# Patient Record
Sex: Female | Born: 1950
Health system: Southern US, Community
[De-identification: ages and names within clinical notes are randomized; demographics above are authoritative.]

## PROBLEM LIST (undated history)

## (undated) DIAGNOSIS — G510 Bell's palsy: Secondary | ICD-10-CM

## (undated) DIAGNOSIS — G4733 Obstructive sleep apnea (adult) (pediatric): Secondary | ICD-10-CM

## (undated) DIAGNOSIS — E7800 Pure hypercholesterolemia, unspecified: Secondary | ICD-10-CM

## (undated) DIAGNOSIS — I639 Cerebral infarction, unspecified: Secondary | ICD-10-CM

## (undated) HISTORY — PX: ABDOMINAL HYSTERECTOMY: SHX81

## (undated) HISTORY — DX: Bell's palsy: G51.0

## (undated) HISTORY — DX: Pure hypercholesterolemia, unspecified: E78.00

## (undated) HISTORY — PX: TONSILLECTOMY: SUR1361

## (undated) HISTORY — PX: APPENDECTOMY: SHX54

## (undated) HISTORY — DX: Cerebral infarction, unspecified: I63.9

## (undated) HISTORY — DX: Obstructive sleep apnea (adult) (pediatric): G47.33

---

## 1999-09-24 ENCOUNTER — Encounter: Payer: Self-pay | Admitting: Internal Medicine

## 1999-09-24 ENCOUNTER — Encounter: Admission: RE | Admit: 1999-09-24 | Discharge: 1999-09-24 | Payer: Self-pay | Admitting: Internal Medicine

## 2001-03-24 ENCOUNTER — Other Ambulatory Visit: Admission: RE | Admit: 2001-03-24 | Discharge: 2001-03-24 | Payer: Self-pay | Admitting: Obstetrics and Gynecology

## 2004-10-07 ENCOUNTER — Ambulatory Visit: Payer: Self-pay | Admitting: Internal Medicine

## 2005-08-27 ENCOUNTER — Ambulatory Visit: Payer: Self-pay | Admitting: Internal Medicine

## 2006-01-20 ENCOUNTER — Ambulatory Visit: Payer: Self-pay | Admitting: Internal Medicine

## 2013-06-29 ENCOUNTER — Telehealth: Payer: Self-pay

## 2013-06-29 NOTE — Telephone Encounter (Signed)
Patient called to let medical records know that her husbands records need to be sent to [facility] at [address] [place] [address]  To Dr. Hyacinth Meeker and the fax number is: [phone]. I was unable to find his release in the release book. Does anyone know where it might be?

## 2013-06-29 NOTE — Telephone Encounter (Signed)
Wrong address entered its supposed to be: [address] in [place].

## 2013-06-29 NOTE — Telephone Encounter (Signed)
Mikayla Ross    PATIENT WOULD LIKE YOU TO CALL HER AT [phone] CONTINUING CALL FROM YESTERDAY

## 2013-06-29 NOTE — Telephone Encounter (Signed)
Faxed release to request records to their office.

## 2017-12-22 ENCOUNTER — Emergency Department (HOSPITAL_COMMUNITY)
Admission: EM | Admit: 2017-12-22 | Discharge: 2017-12-22 | Disposition: A | Discharge status: Home / Self Care | Payer: Medicare HMO | Attending: Emergency Medicine | Admitting: Emergency Medicine

## 2017-12-22 ENCOUNTER — Encounter (HOSPITAL_COMMUNITY): Payer: Self-pay | Admitting: Emergency Medicine

## 2017-12-22 ENCOUNTER — Other Ambulatory Visit: Payer: Self-pay

## 2017-12-22 DIAGNOSIS — G510 Bell's palsy: Secondary | ICD-10-CM | POA: Insufficient documentation

## 2017-12-22 DIAGNOSIS — R29810 Facial weakness: Secondary | ICD-10-CM | POA: Diagnosis present

## 2017-12-22 MED ORDER — PREDNISONE 10 MG (21) PO TBPK: ORAL_TABLET | Freq: Every day | ORAL | 0 refills | Status: DC

## 2017-12-22 MED ORDER — ACYCLOVIR 400 MG PO TABS: 400.0000 mg | ORAL_TABLET | Freq: Four times a day (QID) | ORAL | 0 refills | Status: AC

## 2017-12-22 NOTE — ED Notes (Signed)
Pt voices understanding of discharge instructions and follow up information. NAD on departure. A/o x4.

## 2017-12-22 NOTE — ED Triage Notes (Signed)
Pt to ER for evaluation of right facial droop and inability to close her eye, denies all other weakness, dizziness, visual difficulty. No weakness noted on exam, no drift, no aphasia. MD Long in the assess patient. These symptoms began yesterday.

## 2017-12-22 NOTE — ED Notes (Signed)
EDP Long confirms Bells Palsy.

## 2017-12-22 NOTE — Discharge Instructions (Signed)
You were seen in the emergency department with weakness on the right side of your face.  We are prescribing steroid and antiviral medicines.  Take these as directed.  Use over-the-counter eyedrops to keep the right eye moist.  Tape it closed at night.  Follow-up with your primary care physician or allergy as needed.  Return to the emergency department with any weakness or numbness in the arms or legs.  Also return with any pain in the right eye.

## 2017-12-22 NOTE — ED Provider Notes (Signed)
Emergency Department Provider Note   I have reviewed the triage vital signs and the nursing notes.   HISTORY  Chief Complaint Facial Droop (bells palsy)   HPI Mikayla Ross is a 67 y.o. female presents to the emergency department for evaluation of right face weakness and eye watering starting yesterday.  Patient denies any pain in the eye.  No numbness or tingling.  No weakness in the upper or lower extremities.  Denies any ear pain or ringing in the ears.  No rash to the face.  No history of similar symptoms.  No gait instability, difficulty speaking, or difficulty swallowing.  The patient denies any pain in the right eye but states it is irritated and watering frequently.  She does not wear contact lenses.  No radiation of symptoms or modifying factors.   History reviewed. No pertinent past medical history.  There are no active problems to display for this patient.   History reviewed. No pertinent surgical history.    Allergies Patient has no allergy information on record.  History reviewed. No pertinent family history.  Social History Social History   Tobacco Use  . Smoking status: Never Smoker  . Smokeless tobacco: Never Used  Substance Use Topics  . Alcohol use: No    Frequency: Never  . Drug use: No    Review of Systems  Constitutional: No fever/chills Eyes: No visual changes. ENT: No sore throat. Cardiovascular: Denies chest pain. Respiratory: Denies shortness of breath. Gastrointestinal: No abdominal pain.  No nausea, no vomiting.  No diarrhea.  No constipation. Genitourinary: Negative for dysuria. Musculoskeletal: Negative for back pain. Skin: Negative for rash. Neurological: Negative for headaches, focal weakness or numbness. Positive right face droop.   10-point ROS otherwise negative.  ____________________________________________   PHYSICAL EXAM:  VITAL SIGNS: ED Triage Vitals  Enc Vitals Group     BP 12/22/17 1914 (!) 190/79     Pulse  Rate 12/22/17 1914 74     Resp 12/22/17 1914 16     Temp 12/22/17 1914 98.3 F (36.8 C)     Temp Source 12/22/17 1914 Oral     SpO2 12/22/17 1914 98 %     Pain Score 12/22/17 1916 0   Constitutional: Alert and oriented. Well appearing and in no acute distress. Eyes: Conjunctivae are normal. PERRL. EOMI. Inability to fully close the right eyelid.  Head: Atraumatic. Nose: No congestion/rhinnorhea. Mouth/Throat: Mucous membranes are moist.  Neck: No stridor.  Cardiovascular: Normal rate, regular rhythm. Good peripheral circulation. Grossly normal heart sounds.   Respiratory: Normal respiratory effort.  No retractions. Lungs CTAB. Gastrointestinal: Soft and nontender. No distention.  Musculoskeletal: No lower extremity tenderness nor edema. No gross deformities of extremities. Neurologic:  Normal speech and language. No gross focal neurologic deficits are appreciated. Dense right face weakness involving the forehead and right eyelid. Normal sensation to face and extremities. No pronator drift. Normal gait.  Skin:  Skin is warm, dry and intact. No rash noted.  ____________________________________________  RADIOLOGY  None ____________________________________________   PROCEDURES  Procedure(s) performed:   Procedures  None ____________________________________________   INITIAL IMPRESSION / ASSESSMENT AND PLAN / ED COURSE  Pertinent labs & imaging results that were available during my care of the patient were reviewed by me and considered in my medical decision making (see chart for details).  Patient presents to the emergency department for evaluation of right face droop with exam consistent for Bell's palsy.  No other focal neurological deficits to suggest stroke.  No  indication for imaging of the head or labs at this time.  I had a Cato Liburd discussion with the patient regarding eye care including moisturizing drops and keeping the eye closed while asleep.  No symptoms or signs to  suggest corneal abrasion.  Starting patient on steroid along with antiviral medication.  She will follow with her PCP and neurology as needed if symptoms persist.   At this time, I do not feel there is any life-threatening condition present. I have reviewed and discussed all results (EKG, imaging, lab, urine as appropriate), exam findings with patient. I have reviewed nursing notes and appropriate previous records.  I feel the patient is safe to be discharged home without further emergent workup. Discussed usual and customary return precautions. Patient and family (if present) verbalize understanding and are comfortable with this plan.  Patient will follow-up with their primary care provider. If they do not have a primary care provider, information for follow-up has been provided to them. All questions have been answered.  ____________________________________________  FINAL CLINICAL IMPRESSION(S) / ED DIAGNOSES  Final diagnoses:  Bell's palsy     NEW OUTPATIENT MEDICATIONS STARTED DURING THIS VISIT:  Discharge Medication List as of 12/22/2017  7:32 PM    START taking these medications   Details  acyclovir (ZOVIRAX) 400 MG tablet Take 1 tablet (400 mg total) by mouth 4 (four) times daily for 10 days., Starting Wed 12/22/2017, Until Sat 01/01/2018, Print    predniSONE (STERAPRED UNI-PAK 21 TAB) 10 MG (21) TBPK tablet Take by mouth daily. Take 6 tabs by mouth daily  for 2 days, then 5 tabs for 2 days, then 4 tabs for 2 days, then 3 tabs for 2 days, 2 tabs for 2 days, then 1 tab by mouth daily for 2 days, Starting Wed 12/22/2017, Print        Note:  This document was prepared using Dragon voice recognition software and may include unintentional dictation errors.  Alona BeneJoshua Eloisa Chokshi, MD Emergency Medicine    Theona Muhs, Arlyss RepressJoshua G, MD 12/22/17 [phone]

## 2017-12-27 ENCOUNTER — Encounter: Payer: Self-pay | Admitting: Neurology

## 2017-12-27 ENCOUNTER — Ambulatory Visit: Payer: Medicare HMO | Admitting: Neurology

## 2017-12-27 VITALS — BP 128/70 | HR 89 | Ht 63.0 in | Wt 131.5 lb

## 2017-12-27 DIAGNOSIS — R0989 Other specified symptoms and signs involving the circulatory and respiratory systems: Secondary | ICD-10-CM

## 2017-12-27 DIAGNOSIS — G510 Bell's palsy: Secondary | ICD-10-CM

## 2017-12-27 HISTORY — DX: Bell's palsy: G51.0

## 2017-12-27 NOTE — Progress Notes (Signed)
Reason for visit: Right Bell's palsy  Referring physician: [place]  Mikayla Ross is a 67 y.o. female  History of present illness:  Mikayla Ross is a 67 year old right-handed white female with a history of onset of right facial weakness that came on spontaneously around 22 December 2017.  The patient had onset of weakness throughout the day, she denied any pain in the head or ear or jaw.  She has not had any alteration in hearing or change in taste.  The entire right side of the face became somewhat weak.  She went to the emergency room and was told she had Bell's palsy and was placed on prednisone and a course of Valtrex.  She is currently taking these medications.  She is using eyedrops in the eye, and she is careful to protect the cornea.  She is able to close the right eye if she tries hard.  The patient denies any problems with swallowing, she denies double vision or loss of vision.  She has not had any numbness or weakness of the arms or legs.  She has no numbness on the face.  She denies any balance changes or falls.  Has no problems controlling the bowels or the bladder.  She is sent to this office for further evaluation.  Past Medical History:  Diagnosis Date  . Right-sided Bell's palsy 12/27/2017    Past Surgical History:  Procedure Laterality Date  . APPENDECTOMY    . TONSILLECTOMY      Family History  Problem Relation Age of Onset  . Renal Disease Mother   . Seizures Father   . Renal Disease Father     Social history:  reports that she has been smoking.  She has been smoking about 0.50 packs per day. Her smokeless tobacco use includes chew. She reports that she does not drink alcohol or use drugs.  Medications:  Prior to Admission medications   Medication Sig Start Date End Date Taking? Authorizing Provider  acyclovir (ZOVIRAX) 400 MG tablet Take 1 tablet (400 mg total) by mouth 4 (four) times daily for 10 days. 12/22/17 01/01/18 Yes Long, Arlyss RepressJoshua G, MD  predniSONE (STERAPRED  UNI-PAK 21 TAB) 10 MG (21) TBPK tablet Take by mouth daily. Take 6 tabs by mouth daily  for 2 days, then 5 tabs for 2 days, then 4 tabs for 2 days, then 3 tabs for 2 days, 2 tabs for 2 days, then 1 tab by mouth daily for 2 days 12/22/17  Yes Long, Arlyss RepressJoshua G, MD     Not on File  ROS:  Out of a complete 14 system review of symptoms, the patient complains only of the following symptoms, and all other reviewed systems are negative.  Right face weakness  Blood pressure 128/70, pulse 89, height 5\' 3"  (1.6 m), weight 131 lb 8 oz (59.6 kg), SpO2 98 %.  Physical Exam  General: The patient is alert and cooperative at the time of the examination.  Eyes: Pupils are equal, round, and reactive to light. Discs are flat bilaterally.  Ears: Tympanic membranes are clear bilaterally.  Neck: The neck is supple, bilateral carotid bruits versus transmitted cardiac murmur is noted.  Respiratory: The respiratory examination is clear.  Cardiovascular: The cardiovascular examination reveals a regular rate and rhythm, with a grade I/VI systolic ejection murmur in the aortic area.  Skin: Extremities are without significant edema.  Neurologic Exam  Mental status: The patient is alert and oriented x 3 at the time of  the examination. The patient has apparent normal recent and remote memory, with an apparently normal attention span and concentration ability.  Cranial nerves: Facial symmetry is not present. There is good sensation of the face to pinprick and soft touch bilaterally. The strength of the muscles to head turning and shoulder shrug are normal bilaterally.  The patient has facial asymmetry, decreased strength on the right face including the forehead.  Speech is well enunciated, no aphasia or dysarthria is noted. Extraocular movements are full. Visual fields are full. The tongue is midline, and the patient has symmetric elevation of the soft palate. No obvious hearing deficits are noted.  Motor: The motor  testing reveals 5 over 5 strength of all 4 extremities. Good symmetric motor tone is noted throughout.  Sensory: Sensory testing is intact to pinprick, soft touch, vibration sensation, and position sense on all 4 extremities. No evidence of extinction is noted.  Coordination: Cerebellar testing reveals good finger-nose-finger and heel-to-shin bilaterally.  Gait and station: Gait is normal. Tandem gait is normal. Romberg is negative. No drift is seen.  Reflexes: Deep tendon reflexes are symmetric and normal bilaterally. Toes are downgoing bilaterally.   Assessment/Plan:  1.  Right Bell's palsy  2.  Bilateral carotid bruits versus transmitted cardiac murmur  The patient has what looks like a right Bell's palsy without complete weakness.  Hopefully her recovery time will be relatively short.  She will try to protect the cornea, get eyedrops and use them frequently.  The patient may have bilateral carotid bruits versus a transmitted cardiac murmur, we will get carotid Doppler studies.  The patient will follow-up in 3 months.  The patient indicates that she does not have a primary care physician.  Marlan Palau MD 12/27/2017 10:39 AM  [facility] [address] [place], [place] [address]  Phone [phone] Fax [phone]

## 2017-12-27 NOTE — Patient Instructions (Addendum)
Get a wetting solution for the eyes, such as artificial tears with methylcellulose.  We will get a carotid doppler.   Bell Palsy, Adult Bell palsy is a short-term inability to move muscles in part of the face. The inability to move (paralysis) results from inflammation or compression of the facial nerve, which travels along the skull and under the ear to the side of the face (7th cranial nerve). This nerve is responsible for facial movements that include blinking, closing the eyes, smiling, and frowning. What are the causes? The exact cause of this condition is not known. It may be caused by an infection from a virus, such as the chickenpox (herpes zoster), Epstein-Barr, or mumps virus. What increases the risk? You are more likely to develop this condition if:  You are pregnant.  You have diabetes.  You have had a recent infection in your nose, throat, or airways (upper respiratory infection).  You have a weakened body defense system (immune system).  You have had a facial injury, such as a fracture.  You have a family history of Bell palsy.  What are the signs or symptoms? Symptoms of this condition include:  Weakness on one side of the face.  Drooping eyelid and corner of the mouth.  Excessive tearing in one eye.  Difficulty closing the eyelid.  Dry eye.  Drooling.  Dry mouth.  Changes in taste.  Change in facial appearance.  Pain behind one ear.  Ringing in one or both ears.  Sensitivity to sound in one ear.  Facial twitching.  Headache.  Impaired speech.  Dizziness.  Difficulty eating or drinking.  Most of the time, only one side of the face is affected. Rarely, Bell palsy affects the whole face. How is this diagnosed? This condition is diagnosed based on:  Your symptoms.  Your medical history.  A physical exam.  You may also have to see health care providers who specialize in disorders of the nerves (neurologist) or diseases and conditions  of the eye (ophthalmologist). You may have tests, such as:  A test to check for nerve damage (electromyogram).  Imaging studies, such as CT or MRI scans.  Blood tests.  How is this treated? This condition affects every person differently. Sometimes symptoms go away without treatment within a couple weeks. If treatment is needed, it varies from person to person. The goal of treatment is to reduce inflammation and protect the eye from damage. Treatment for Bell palsy may include:  Medicines, such as: ? Steroids to reduce swelling and inflammation. ? Antiviral drugs. ? Pain relievers, including aspirin, acetaminophen, or ibuprofen.  Eye drops or ointment to keep your eye moist.  Eye protection, if you cannot close your eye.  Exercises or massage to regain muscle strength and function (physical therapy).  Follow these instructions at home:  Take over-the-counter and prescription medicines only as told by your health care provider.  If your eye is affected: ? Keep your eye moist with eye drops or ointment as told by your health care provider. ? Follow instructions for eye care and protection as told by your health care provider.  Do any physical therapy exercises as told by your health care provider.  Keep all follow-up visits as told by your health care provider. This is important. Contact a health care provider if:  You have a fever.  Your symptoms do not get better within 2-3 weeks, or your symptoms get worse.  Your eye is red, irritated, or painful.  You have new  symptoms. Get help right away if:  You have weakness or numbness in a part of your body other than your face.  You have trouble swallowing.  You develop neck pain or stiffness.  You develop dizziness or shortness of breath. Summary  Bell palsy is a short-term inability to move muscles in part of the face. The inability to move (paralysis) results from inflammation or compression of the facial  nerve.  This condition affects every person differently. Sometimes symptoms go away without treatment within a couple weeks.  If treatment is needed, it varies from person to person. The goal of treatment is to reduce inflammation and protect the eye from damage.  Contact your health care provider if your symptoms do not get better within 2-3 weeks, or your symptoms get worse. This information is not intended to replace advice given to you by your health care provider. Make sure you discuss any questions you have with your health care provider. Document Released: 11/16/2005 Document Revised: 01/19/2017 Document Reviewed: 01/19/2017 Elsevier Interactive Patient Education  [employer]2018 [employer]vier Inc[employer]

## 2018-01-03 ENCOUNTER — Telehealth: Payer: Self-pay | Admitting: Neurology

## 2018-01-03 ENCOUNTER — Ambulatory Visit (HOSPITAL_COMMUNITY)
Admission: RE | Admit: 2018-01-03 | Discharge: 2018-01-03 | Disposition: A | Discharge status: Home / Self Care | Payer: Medicare HMO | Source: Ambulatory Visit | Attending: Neurology | Admitting: Neurology

## 2018-01-03 DIAGNOSIS — I6523 Occlusion and stenosis of bilateral carotid arteries: Secondary | ICD-10-CM | POA: Diagnosis not present

## 2018-01-03 DIAGNOSIS — R0989 Other specified symptoms and signs involving the circulatory and respiratory systems: Secondary | ICD-10-CM

## 2018-01-03 NOTE — Telephone Encounter (Signed)
I called the patient.  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   Carotid doppler 01/03/18:  Right Carotid: Velocities in the right ICA are consistent with a 40-59%        stenosis. The ECA appears >50% stenosed.  Left Carotid: Velocities in the left ICA are consistent with a 1-39% stenosis.  Vertebrals: Right vertebral artery was patent with antegrade flow. Left      vertebral demonstrates abnormal flow. suggestive of distal      stenosis/occlusion

## 2018-01-03 NOTE — Progress Notes (Signed)
*  PRELIMINARY RESULTS* Vascular Ultrasound Carotid Duplex (Doppler) has been completed.  Preliminary findings:   Right Carotid: Velocities in the right ICA are consistent with a 40-59% stenosis. The ECA appears >50% stenosed.    Left Carotid: Velocities in the left ICA are consistent with a 1-39% stenosis.    Vertebrals: Right vertebral artery was patent with antegrade flow. Left vertebral demonstrates abnormal flow.     Mikayla FischerCharlotte C Jenney Brester 01/03/2018, 12:07 PM

## 2018-01-05 ENCOUNTER — Encounter: Payer: Self-pay | Admitting: Family Medicine

## 2018-01-05 ENCOUNTER — Other Ambulatory Visit: Payer: Self-pay | Admitting: Family Medicine

## 2018-01-05 ENCOUNTER — Ambulatory Visit (INDEPENDENT_AMBULATORY_CARE_PROVIDER_SITE_OTHER): Payer: Medicare HMO | Admitting: Family Medicine

## 2018-01-05 VITALS — BP 120/72 | HR 73 | Temp 98.3°F | Ht 63.0 in | Wt 127.5 lb

## 2018-01-05 DIAGNOSIS — I6523 Occlusion and stenosis of bilateral carotid arteries: Secondary | ICD-10-CM | POA: Diagnosis not present

## 2018-01-05 DIAGNOSIS — R413 Other amnesia: Secondary | ICD-10-CM

## 2018-01-05 HISTORY — DX: Occlusion and stenosis of bilateral carotid arteries: I65.23

## 2018-01-05 LAB — VITAMIN B12: VITAMIN B 12: 408 pg/mL (ref 211–911)

## 2018-01-05 LAB — CBC
HCT: 40.7 % (ref 36.0–46.0)
Hemoglobin: 14 g/dL (ref 12.0–15.0)
MCHC: 34.3 g/dL (ref 30.0–36.0)
MCV: 95.3 fl (ref 78.0–100.0)
Platelets: 187 10*3/uL (ref 150.0–400.0)
RBC: 4.27 Mil/uL (ref 3.87–5.11)
RDW: 12.6 % (ref 11.5–15.5)
WBC: 7.8 10*3/uL (ref 4.0–10.5)

## 2018-01-05 LAB — COMPREHENSIVE METABOLIC PANEL
ALT: 10 U/L (ref 0–35)
AST: 12 U/L (ref 0–37)
Albumin: 4.1 g/dL (ref 3.5–5.2)
Alkaline Phosphatase: 36 U/L — ABNORMAL LOW (ref 39–117)
BUN: 16 mg/dL (ref 6–23)
CO2: 31 mEq/L (ref 19–32)
Calcium: 9.4 mg/dL (ref 8.4–10.5)
Chloride: 106 mEq/L (ref 96–112)
Creatinine, Ser: 0.76 mg/dL (ref 0.40–1.20)
GFR: 81.06 mL/min (ref >60.00)
Glucose, Bld: 80 mg/dL (ref 70–99)
Potassium: 5.1 mEq/L (ref 3.5–5.1)
Sodium: 142 mEq/L (ref 135–145)
Total Bilirubin: 0.5 mg/dL (ref 0.2–1.2)
Total Protein: 6.6 g/dL (ref 6.0–8.3)

## 2018-01-05 LAB — TSH: TSH: 1.28 u[IU]/mL (ref 0.35–4.50)

## 2018-01-05 MED ORDER — ATORVASTATIN CALCIUM 20 MG PO TABS: 20.0000 mg | ORAL_TABLET | Freq: Every day | ORAL | 3 refills | Status: DC

## 2018-01-05 NOTE — Progress Notes (Signed)
Chief Complaint  Patient presents with  . Establish Care       New Patient Visit SUBJECTIVE: HPI: Mikayla Ross is an 67 y.o.female who is being seen for establishing care.  The patient has not been seen in >15 yrs.  The patient recently had a carotid Doppler that showed moderate stenosis on the right and mild stenosis on the left.  Her neurologist, who ordered the study, recommended low-dose aspirin and following up in 1 year.  She is not on a statin.  Her diet is fair, she is physically active however he dedicates no time to exercise.  For the past several months, the patient has been having worsening memory and decreased concentration.  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  As her father's medical needs have increased, so has her stress and a correlation with her worsening confusion has also been noted.  This bothers her husband more than the patient.  She has never been diagnosed with dementia and there is no family history of early onset Alzheimer's or dementia.  Her highest education level is high school.  No injuries to her head.  She takes no medications routinely.  Past Medical History:  Diagnosis Date  . Right-sided Bell's palsy 12/27/2017   Past Surgical History:  Procedure Laterality Date  . APPENDECTOMY    . TONSILLECTOMY     Social History   Socioeconomic History  . Marital status: Married  Occupational History  . Occupation: Retired  Tobacco Use  . Smoking status: Current Every Day Smoker    Packs/day: 0.50  . Smokeless tobacco: Current User    Types: Chew  Substance and Sexual Activity  . Alcohol use: No    Frequency: Never  . Drug use: No  Social History Narrative   Lives with husband   Caffeine use: Coffee daily and diet soda   Right handed    Family History  Problem Relation Age of Onset  . Renal Disease Mother   . Seizures Father   . Renal Disease Father    Takes no medications  routinely  ROS Cardiovascular: Denies chest pain  Neuro: As noted in HPI   OBJECTIVE: BP 120/72 (BP Location: Left Arm, Patient Position: Sitting, Cuff Size: Normal)   Pulse 73   Temp 98.3 F (36.8 C) (Oral)   Ht 5\' 3"  (1.6 m)   Wt 127 lb 8 oz (57.8 kg)   SpO2 99%   BMI 22.59 kg/m   Constitutional: -  VS reviewed -  Well developed, well nourished, appears stated age -  No apparent distress  Psychiatric: -  Oriented to person, place, and time -  Memory intact -  Affect and mood normal -  Fluent conversation, good eye contact -  Judgment and insight age appropriate  Eye: -  Conjunctivae clear, no discharge -  Pupils symmetric, round, reactive to light  ENMT: -  MMM    Pharynx moist, no exudate, no erythema  Neck: -  No gross swelling, no palpable masses -  Thyroid midline, not enlarged, mobile, no palpable masses  Cardiovascular: -  RRR -  No LE edema -  +b/l carotid bruits  Respiratory: -  Normal respiratory effort, no accessory muscle use, no retraction -  Breath sounds equal, no wheezes, no ronchi, no crackles  Neurological:  -  CN II - XII grossly intact -Alert to person, place, president, and year; did not know day of the  week, month, or date (other than year)  Musculoskeletal: -  No clubbing, no cyanosis -  Gait normal  Skin: -  No significant lesion on inspection -  Warm and dry to palpation   ASSESSMENT/PLAN: Memory loss or impairment - Plan: Ambulatory referral to Neuropsychology, CBC, Comprehensive metabolic panel, TSH, B12  Bilateral carotid artery stenosis - Plan: atorvastatin (LIPITOR) 20 MG tablet  Refer to neuropsychology team with [facility] for formal evaluation for possible dementia.  We will check some labs and if they are normal, will check a CT scan of her head without contrast.  I suspect there may be a stress component to this issue.  I did offer treatment with medication versus psychology, however the patient wishes to check labs and undergo formal  evaluation first.  Vascular dementia versus other type of dementia also in the differential. We will start Lipitor for her carotid artery stenosis.  Counseled on diet and exercise.  Encourage smoking cessation. Patient should return in 1 mo for CPE.  We will cover health maintenance topics at that visit. The patient and her husband voiced understanding and agreement to the plan.   Jilda Rocheicholas Paul [place], DO 01/05/18  12:23 PM

## 2018-01-05 NOTE — Progress Notes (Signed)
Pre visit review using our clinic review tool, if applicable. No additional management support is needed unless otherwise documented below in the visit note. 

## 2018-01-05 NOTE — Patient Instructions (Addendum)
If you do not hear anything about your referral in the next 1-2 weeks, call our office and ask for an update.  If your labs are normal, we will be ordering a scan of your head.  Aim to do some physical exertion for 150 minutes per week. This is typically divided into 5 days per week, 30 minutes per day. The activity should be enough to get your heart rate up. Anything is better than nothing if you have time constraints.  Let us know if you need anything.

## 2018-01-07 ENCOUNTER — Encounter: Payer: Self-pay | Admitting: Family Medicine

## 2018-01-12 ENCOUNTER — Telehealth: Payer: Self-pay | Admitting: Family Medicine

## 2018-01-12 NOTE — Telephone Encounter (Signed)
Called left message to call back 

## 2018-01-12 NOTE — Telephone Encounter (Signed)
Her neurologist ordered this and has gone over it with her per a note. It showed some occlusion, but nothing requiring surgery or immediate attention. She can call Dr. Anne HahnWillis' office for a more detailed report.

## 2018-01-12 NOTE — Telephone Encounter (Signed)
Copied from CRM [id number]. Topic: Quick Communication - See Telephone Encounter >> Jan 12, 2018 11:28 AM Terisa Starraylor, Brittany L wrote: CRM for notification. See Telephone encounter for:   01/12/18.   Patient wants to know the results of her artery scan. Call back is [phone]

## 2018-01-13 NOTE — Telephone Encounter (Signed)
Called left message to call back 

## 2018-02-14 ENCOUNTER — Encounter: Payer: Self-pay | Admitting: Family Medicine

## 2018-02-14 ENCOUNTER — Ambulatory Visit (INDEPENDENT_AMBULATORY_CARE_PROVIDER_SITE_OTHER): Payer: Medicare HMO | Admitting: Family Medicine

## 2018-02-14 VITALS — BP 120/78 | HR 68 | Temp 98.0°F | Ht 63.0 in | Wt 130.5 lb

## 2018-02-14 DIAGNOSIS — Z23 Encounter for immunization: Secondary | ICD-10-CM

## 2018-02-14 DIAGNOSIS — Z720 Tobacco use: Secondary | ICD-10-CM | POA: Diagnosis not present

## 2018-02-14 DIAGNOSIS — Z114 Encounter for screening for human immunodeficiency virus [HIV]: Secondary | ICD-10-CM

## 2018-02-14 DIAGNOSIS — Z0000 Encounter for general adult medical examination without abnormal findings: Secondary | ICD-10-CM | POA: Insufficient documentation

## 2018-02-14 DIAGNOSIS — Z1159 Encounter for screening for other viral diseases: Secondary | ICD-10-CM | POA: Diagnosis not present

## 2018-02-14 DIAGNOSIS — Z1231 Encounter for screening mammogram for malignant neoplasm of breast: Secondary | ICD-10-CM | POA: Diagnosis not present

## 2018-02-14 DIAGNOSIS — R69 Illness, unspecified: Secondary | ICD-10-CM | POA: Diagnosis not present

## 2018-02-14 DIAGNOSIS — Z1239 Encounter for other screening for malignant neoplasm of breast: Secondary | ICD-10-CM | POA: Insufficient documentation

## 2018-02-14 DIAGNOSIS — E2839 Other primary ovarian failure: Secondary | ICD-10-CM | POA: Diagnosis not present

## 2018-02-14 HISTORY — DX: Other primary ovarian failure: E28.39

## 2018-02-14 LAB — LIPID PANEL
Cholesterol: 199 mg/dL (ref 0–200)
HDL: 91.9 mg/dL (ref >39.00)
LDL Cholesterol: 97 mg/dL (ref 0–99)
NONHDL: 106.95
Total CHOL/HDL Ratio: 2
Triglycerides: 48 mg/dL (ref 0.0–149.0)
VLDL: 9.6 mg/dL (ref 0.0–40.0)

## 2018-02-14 LAB — COMPREHENSIVE METABOLIC PANEL
ALK PHOS: 43 U/L (ref 39–117)
ALT: 14 U/L (ref 0–35)
AST: 14 U/L (ref 0–37)
Albumin: 4.5 g/dL (ref 3.5–5.2)
BUN: 14 mg/dL (ref 6–23)
CO2: 30 meq/L (ref 19–32)
Calcium: 9.6 mg/dL (ref 8.4–10.5)
Chloride: 105 mEq/L (ref 96–112)
Creatinine, Ser: 0.82 mg/dL (ref 0.40–1.20)
GFR: 74.23 mL/min (ref >60.00)
GLUCOSE: 87 mg/dL (ref 70–99)
Potassium: 3.8 mEq/L (ref 3.5–5.1)
Sodium: 142 mEq/L (ref 135–145)
TOTAL PROTEIN: 7 g/dL (ref 6.0–8.3)
Total Bilirubin: 0.6 mg/dL (ref 0.2–1.2)

## 2018-02-14 NOTE — Progress Notes (Signed)
Pre visit review using our clinic review tool, if applicable. No additional management support is needed unless otherwise documented below in the visit note. 

## 2018-02-14 NOTE — Progress Notes (Addendum)
Chief Complaint  Patient presents with  . Annual Exam     Well Woman Mikayla Ross is here for a complete physical.   Her last physical was >1 year ago.  Current diet: in general, a "healthy" diet. Current exercise: physically active at home. Weight is stable and she denies daytime fatigue. No LMP recorded.  Seatbelt? Yes  Health Maintenance Colonoscopy- No - refuses DEXA- No  Mammogram- No Tetanus- No Pneumonia- No Hep C screen- No  Past Medical History:  Diagnosis Date  . Right-sided Bell's palsy 12/27/2017     Past Surgical History:  Procedure Laterality Date  . ABDOMINAL HYSTERECTOMY    . APPENDECTOMY    . TONSILLECTOMY      Medications  Current Outpatient Medications on File Prior to Visit  Medication Sig Dispense Refill  . atorvastatin (LIPITOR) 20 MG tablet Take 1 tablet (20 mg total) by mouth daily. 90 tablet 3    Allergies Not on File  Review of Systems: Constitutional:  no fevers, sweats, or chills Eye:  no recent significant change in vision Ear/Nose/Mouth/Throat:  Ears:  No changes in hearing, Nose/Mouth/Throat:  no complaints of nasal congestion, no sore throat Cardiovascular: no chest pain Respiratory:  No cough and no shortness of breath Gastrointestinal:  no abdominal pain, no change in bowel habits, no significant change in appetite, no nausea, vomiting, diarrhea, or constipation and no black or bloody stool GU:  Female: negative for dysuria, frequency, and incontinence; no abnormal bleeding, pelvic pain, or discharge Musculoskeletal/Extremities:  no pain of the joints Integumentary (Skin/Breast):  no abnormal skin lesions reported Neurologic:  no headaches Psychiatric:  no anxiety, no depression Endocrine:  denies fatigue Hematologic/Lymphatic:  no abnormal bleeding  Exam BP 120/78 (BP Location: Left Arm, Patient Position: Sitting, Cuff Size: Normal)   Pulse 68   Temp 98 F (36.7 C) (Oral)   Ht 5\' 3"  (1.6 m)   Wt 130 lb 8 oz (59.2 kg)    SpO2 98%   BMI 23.12 kg/m  General:  well developed, well nourished, in no apparent distress Skin:  no significant moles, warts, or growths Head:  no masses, lesions, or tenderness Eyes:  pupils equal and round, sclera anicteric without injection Ears:  canals without lesions, TMs shiny without retraction, no obvious effusion, no erythema Nose:  nares patent, septum midline, mucosa normal, and no drainage or sinus tenderness Throat/Pharynx:  lips and gingiva without lesion; tongue and uvula midline; non-inflamed pharynx; no exudates or postnasal drainage Neck: neck supple without adenopathy, thyromegaly, or masses Breasts:  Not done Thorax:  nontender Lungs:  clear to auscultation, breath sounds equal bilaterally, no respiratory distress Cardio:  regular rate and rhythm without murmurs, heart sounds without clicks or rubs, point of maximal impulse normal; no lifts, heaves, or thrills Abdomen:  abdomen soft, nontender; bowel sounds normal; no masses or organomegaly Genital: Deferred Musculoskeletal:  symmetrical muscle groups noted without atrophy or deformity Extremities:  no clubbing, cyanosis, or edema, no deformities, no skin discoloration Neuro:  gait normal; deep tendon reflexes normal and symmetric Psych: well oriented with normal range of affect and appropriate judgment/insight  Assessment and Plan  Well adult exam - Plan: Comprehensive metabolic panel, Lipid panel  Screening for HIV (human immunodeficiency virus) - Plan: HIV antibody  Encounter for hepatitis C screening test for low risk patient - Plan: Hepatitis C antibody  Screening for breast cancer - Plan: MM DIGITAL SCREENING BILATERAL  Estrogen deficiency - Plan: DG Bone Density   Well 67  y.o. female. Counseled on diet and exercise. Tdap and PCV13, PCV23 in 1 yr.  GYN contact info given in AVS. Counseled on colon cancer screening and stated this could be life saving. Also discussed FIT and cologard. She will think  about it. Other orders as above. 25 pack year smoking hx, counseled on cessation.  Follow up in 6 mo for med check. The patient voiced understanding and agreement to the plan.  Jilda Roche [place], DO 02/14/18 8:52 AM

## 2018-02-14 NOTE — Patient Instructions (Addendum)
Call Center for [facility] at [phone] for an appointment.  They are located at [address], [address], [place], [place], [phone] (right across the hall from our office).  Think about a colonoscopy! It can be life saving.  Aim to do some physical exertion for 150 minutes per week. This is typically divided into 5 days per week, 30 minutes per day. The activity should be enough to get your heart rate up. Anything is better than nothing if you have time constraints.    Colorectal Cancer Screening Colorectal cancer screening is a group of tests used to check for colorectal cancer. Colorectal refers to your colon and rectum. Your colon and rectum are located at the end of your large intestine and carry your bowel movements out of your body. Why is colorectal cancer screening done? It is common for abnormal growths (polyps) to form in the lining of your colon, especially as you get older. These polyps can be cancerous or become cancerous. If colorectal cancer is found at an early stage, it is treatable. Who should be screened for colorectal cancer? Screening is recommended for all adults at average risk starting at age 66. Tests may be recommended every 1 to 10 years. Your health care provider may recommend earlier or more frequent screening if you have:  A history of colorectal cancer or polyps.  A family member with a history of colorectal cancer or polyps.  Inflammatory bowel disease, such as ulcerative colitis or Crohn disease.  A type of hereditary colon cancer syndrome.  Colorectal cancer symptoms.  Types of screening tests There are several types of colorectal screening tests. They include:  Guaiac-based fecal occult blood testing.  Fecal immunochemical test (FIT).  Stool DNA test.  Barium enema.  Virtual colonoscopy.  Sigmoidoscopy. During this test, a sigmoidoscope is used to examine your rectum and lower colon. A sigmoidoscope is a  flexible tube with a camera that is inserted through your anus into your rectum and lower colon.  Colonoscopy. During this test, a colonoscope is used to examine your entire colon. A colonoscope is a long, thin, flexible tube with a camera. This test examines your entire colon and rectum.  This information is not intended to replace advice given to you by your health care provider. Make sure you discuss any questions you have with your health care provider. Document Released: 05/06/2010 Document Revised: 06/25/2016 Document Reviewed: 02/22/2014 Elsevier Interactive Patient Education  [employer]2018 Elsevier Inc.

## 2018-02-14 NOTE — Addendum Note (Signed)
Addended by: Scharlene GlossEWING, ROBIN B on: 02/14/2018 09:47 AM   Modules accepted: Orders

## 2018-02-15 LAB — HIV ANTIBODY (ROUTINE TESTING W REFLEX): HIV: NONREACTIVE

## 2018-02-15 LAB — HEPATITIS C ANTIBODY
Hepatitis C Ab: NONREACTIVE
SIGNAL TO CUT-OFF: 0.01 (ref <1.00)

## 2018-02-17 ENCOUNTER — Ambulatory Visit (HOSPITAL_BASED_OUTPATIENT_CLINIC_OR_DEPARTMENT_OTHER)
Admission: RE | Admit: 2018-02-17 | Discharge: 2018-02-17 | Disposition: A | Discharge status: Home / Self Care | Payer: Medicare HMO | Source: Ambulatory Visit | Attending: Family Medicine | Admitting: Family Medicine

## 2018-02-17 ENCOUNTER — Encounter (HOSPITAL_BASED_OUTPATIENT_CLINIC_OR_DEPARTMENT_OTHER): Payer: Self-pay

## 2018-02-17 DIAGNOSIS — Z1239 Encounter for other screening for malignant neoplasm of breast: Secondary | ICD-10-CM

## 2018-02-17 DIAGNOSIS — Z780 Asymptomatic menopausal state: Secondary | ICD-10-CM | POA: Diagnosis not present

## 2018-02-17 DIAGNOSIS — M85851 Other specified disorders of bone density and structure, right thigh: Secondary | ICD-10-CM | POA: Insufficient documentation

## 2018-02-17 DIAGNOSIS — Z1231 Encounter for screening mammogram for malignant neoplasm of breast: Secondary | ICD-10-CM | POA: Diagnosis not present

## 2018-02-17 DIAGNOSIS — E2839 Other primary ovarian failure: Secondary | ICD-10-CM

## 2018-02-18 ENCOUNTER — Telehealth: Payer: Self-pay | Admitting: Family Medicine

## 2018-02-18 ENCOUNTER — Other Ambulatory Visit: Payer: Self-pay | Admitting: Family Medicine

## 2018-02-18 DIAGNOSIS — R928 Other abnormal and inconclusive findings on diagnostic imaging of breast: Secondary | ICD-10-CM

## 2018-02-18 NOTE — Telephone Encounter (Signed)
Copied from CRM [id number]. Topic: Quick Communication - See Telephone Encounter >> Feb 18, 2018  1:04 PM Landry MellowFoltz, Melissa J wrote: CRM for notification. See Telephone encounter for: 02/18/18. pt would like a call and further clarifications about vitamins that she is taking.  [phone]

## 2018-02-18 NOTE — Telephone Encounter (Signed)
Patient needed clarification regarding todays phone call on bone density results/did give them to her and she did verbalize understanding// see result notes for bone density.

## 2018-02-21 ENCOUNTER — Other Ambulatory Visit: Payer: Self-pay | Admitting: Family Medicine

## 2018-02-21 DIAGNOSIS — Z9189 Other specified personal risk factors, not elsewhere classified: Secondary | ICD-10-CM

## 2018-02-21 MED ORDER — ALENDRONATE SODIUM 70 MG PO TABS: 70.0000 mg | ORAL_TABLET | ORAL | 11 refills | Status: DC

## 2018-02-21 NOTE — Progress Notes (Signed)
Alendronate called in as hip fx risk is >3%.

## 2018-02-22 ENCOUNTER — Telehealth: Payer: Self-pay | Admitting: *Deleted

## 2018-02-22 NOTE — Telephone Encounter (Signed)
Received Physician Orders from [facility]; forwarded to provider/SLS 03/26

## 2018-02-24 ENCOUNTER — Ambulatory Visit
Admission: RE | Admit: 2018-02-24 | Discharge: 2018-02-24 | Disposition: A | Discharge status: Home / Self Care | Payer: Medicare HMO | Source: Ambulatory Visit | Attending: Family Medicine | Admitting: Family Medicine

## 2018-02-24 DIAGNOSIS — R928 Other abnormal and inconclusive findings on diagnostic imaging of breast: Secondary | ICD-10-CM

## 2018-02-24 DIAGNOSIS — N6489 Other specified disorders of breast: Secondary | ICD-10-CM | POA: Diagnosis not present

## 2018-03-30 ENCOUNTER — Ambulatory Visit: Payer: Medicare HMO | Admitting: Adult Health

## 2018-04-05 NOTE — Progress Notes (Deleted)
[facility]  PATIENT: Mikayla Ross DOB: [birth year]  25-Nov-1952N FOR VISIT: *** HISTORY FROM:    HISTORY OF PRESENT ILLNESS:  Carotid doppler 01/03/18:  Right Carotid: Velocities in the right ICA are consistent with a 40-59%        stenosis. The ECA appears >50% stenosed.  Left Carotid: Velocities in the left ICA are consistent with a 1-39% stenosis.  Vertebrals: Right vertebral artery was patent with antegrade flow. Left      vertebral demonstrates abnormal flow. suggestive of distal      stenosis/occlusion The patient will begin asa. Repeat in 1 year   12/27/17 KWMs. Mikayla Ross is a 67 year old right-handed white female with a history of onset of right facial weakness that came on spontaneously around 22 December 2017.  The patient had onset of weakness throughout the day, she denied any pain in the head or ear or jaw.  She has not had any alteration in hearing or change in taste.  The entire right side of the face became somewhat weak.  She went to the emergency room and was told she had Bell's palsy and was placed on prednisone and a course of Valtrex.  She is currently taking these medications.  She is using eyedrops in the eye, and she is careful to protect the cornea.  She is able to close the right eye if she tries hard.  The patient denies any problems with swallowing, she denies double vision or loss of vision.  She has not had any numbness or weakness of the arms or legs.  She has no numbness on the face.  She denies any balance changes or falls.  Has no problems controlling the bowels or the bladder.  She is sent to this office for further evaluation.    REVIEW OF SYSTEMS: Full 14 system review of systems performed and notable only for those listed, all others are neg:  Constitutional: neg  Cardiovascular: neg Ear/Nose/Throat: neg  Skin: neg Eyes: neg Respiratory: neg Gastroitestinal: neg  Hematology/Lymphatic: neg  Endocrine:  neg Musculoskeletal:neg Allergy/Immunology: neg Neurological: neg Psychiatric: neg Sleep : neg   ALLERGIES: Not on File  HOME MEDICATIONS: Outpatient Medications Prior to Visit  Medication Sig Dispense Refill  . alendronate (FOSAMAX) 70 MG tablet Take 1 tablet (70 mg total) by mouth every 7 (seven) days. Take with a full glass of water on an empty stomach. 4 tablet 11  . atorvastatin (LIPITOR) 20 MG tablet Take 1 tablet (20 mg total) by mouth daily. 90 tablet 3   No facility-administered medications prior to visit.     PAST MEDICAL HISTORY: Past Medical History:  Diagnosis Date  . Right-sided Bell's palsy 12/27/2017    PAST SURGICAL HISTORY: Past Surgical History:  Procedure Laterality Date  . ABDOMINAL HYSTERECTOMY    . APPENDECTOMY    . TONSILLECTOMY      FAMILY HISTORY: Family History  Problem Relation Age of Onset  . Renal Disease Mother   . Seizures Father   . Renal Disease Father   . Cancer Neg Hx     SOCIAL HISTORY: Social History   Socioeconomic History  . Marital status: Married    Spouse name: Not on file  . Number of children: 2  . Years of education: 67  . Highest education level: Not on file  Occupational History  . Occupation: Retired  Engineer, production  . Financial resource strain: Not on file  . Food insecurity:    Worry: Not on file  Inability: Not on file  . Transportation needs:    Medical: Not on file    Non-medical: Not on file  Tobacco Use  . Smoking status: Current Every Day Smoker    Packs/day: 0.50    Years: 50.00    Pack years: 25.00  . Smokeless tobacco: Current User    Types: Chew  Substance and Sexual Activity  . Alcohol use: No    Frequency: Never  . Drug use: No  . Sexual activity: Not on file  Lifestyle  . Physical activity:    Days per week: Not on file    Minutes per session: Not on file  . Stress: Not on file  Relationships  . Social connections:    Talks on phone: Not on file    Gets together: Not on  file    Attends religious service: Not on file    Active member of club or organization: Not on file    Attends meetings of clubs or organizations: Not on file    Relationship status: Not on file  . Intimate partner violence:    Fear of current or ex partner: Not on file    Emotionally abused: Not on file    Physically abused: Not on file    Forced sexual activity: Not on file  Other Topics Concern  . Not on file  Social History Narrative   Lives with husband   Caffeine use: Coffee daily and diet soda   Right handed      PHYSICAL EXAM  There were no vitals filed for this visit. There is no height or weight on file to calculate BMI.  Generalized: Well developed, in no acute distress  Head: normocephalic and atraumatic,. Oropharynx benign  Neck: Supple, no carotid bruits  Cardiac: Regular rate rhythm, no murmur  Musculoskeletal: No deformity   Neurological examination   Mentation: Alert oriented to time, place, history taking. Attention span and concentration appropriate. Recent and remote memory intact.  Follows all commands speech and language fluent.   Cranial nerve II-XII: Fundoscopic exam reveals sharp disc margins.Pupils were equal round reactive to light extraocular movements were full, visual field were full on confrontational test. Facial sensation and strength were normal. hearing was intact to finger rubbing bilaterally. Uvula tongue midline. head turning and shoulder shrug were normal and symmetric.Tongue protrusion into cheek strength was normal. Motor: normal bulk and tone, full strength in the BUE, BLE, fine finger movements normal, no pronator drift. No focal weakness Sensory: normal and symmetric to light touch, pinprick, and  Vibration, proprioception  Coordination: finger-nose-finger, heel-to-shin bilaterally, no dysmetria Reflexes: Brachioradialis 2/2, biceps 2/2, triceps 2/2, patellar 2/2, Achilles 2/2, plantar responses were flexor bilaterally. Gait and  Station: Rising up from seated position without assistance, normal stance,  moderate stride, good arm swing, smooth turning, able to perform tiptoe, and heel walking without difficulty. Tandem gait is steady  DIAGNOSTIC DATA (LABS, IMAGING, TESTING) - I reviewed patient records, labs, notes, testing and imaging myself where available.  Lab Results  Component Value Date   WBC 7.8 01/05/2018   HGB 14.0 01/05/2018   HCT 40.7 01/05/2018   MCV 95.3 01/05/2018   PLT 187.0 01/05/2018      Component Value Date/Time   NA 142 02/14/2018 0842   K 3.8 02/14/2018 0842   CL 105 02/14/2018 0842   CO2 30 02/14/2018 0842   GLUCOSE 87 02/14/2018 0842   BUN 14 02/14/2018 0842   CREATININE 0.82 02/14/2018 0842   CALCIUM 9.6  02/14/2018 0842   PROT 7.0 02/14/2018 0842   ALBUMIN 4.5 02/14/2018 0842   AST 14 02/14/2018 0842   ALT 14 02/14/2018 0842   ALKPHOS 43 02/14/2018 0842   BILITOT 0.6 02/14/2018 0842   Lab Results  Component Value Date   CHOL 199 02/14/2018   HDL 91.90 02/14/2018   LDLCALC 97 02/14/2018   TRIG 48.0 02/14/2018   CHOLHDL 2 02/14/2018   No results found for: HGBA1C Lab Results  Component Value Date   VITAMINB12 408 01/05/2018   Lab Results  Component Value Date   TSH 1.28 01/05/2018      ASSESSMENT AND PLAN  67 y.o. year old female  has a past medical history of Right-sided Bell's palsy (12/27/2017). here with ***  Right Bell's palsy  2.  Bilateral carotid bruits versus transmitted cardiac murmur  The patient has what looks like a right Bell's palsy without complete weakness.  Hopefully her recovery time will be relatively short.  She will try to protect the cornea, get eyedrops and use them frequently.  The patient may have bilateral carotid bruits versus a transmitted cardiac murmur, we will get carotid Doppler studies.  The patient will follow-up in 3 months.  The patient indicates that she does not have a primary care physician.   Nilda Riggs,  [facility], [facility], APRN  [facility] [address] [place], [place] [address] [phone]

## 2018-04-06 ENCOUNTER — Ambulatory Visit: Payer: Medicare HMO | Admitting: Nurse Practitioner

## 2018-04-07 ENCOUNTER — Encounter: Payer: Self-pay | Admitting: Nurse Practitioner

## 2018-04-12 NOTE — Progress Notes (Signed)
[facility]  PATIENT: Mikayla Ross DOB: 23-Sep-1951   REASON FOR VISIT: Follow-up for right Bell's palsy, carotid stenosis HISTORY FROM: Patient and  husband   HISTORY OF PRESENT ILLNESS:UPDATE 04/13/18 CM Mikayla Ross, 67 year old female returns for follow-up with history of right Bell's palsy which occurred in January 2019.   Her facial weakness has recovered nicely.  She has no problems swallowing any numbness or weakness of arms face or leg she denies any difficulty with bowel or bladder control.  She has had no visual symptoms.  Carotid Doppler ordered at her last visit shows 40-59% stenosis of the right internal carotid artery, the patient has distal occlusion of the left vertebral artery, the right vertebral may be the dominant artery.  She was asked to go on a low-dose aspirin however patient claims she never got a phone message.  Carotid Doppler to be rechecked in 1 year.  Recent labs with primary care to include lipid panel CMP B12 TSH and CBC all within normal limits.  LDL 97 she was placed on Lipitor by her primary care.  Patient also complains of snoring and her husband reports she snores loudly.  She has never had a sleep study.  Blood pressure in the office today 142/72.  She returns for reevaluation   12/28/17 KWMs. Mikayla Ross is a 67 year old right-handed white female with a history of onset of right facial weakness that came on spontaneously around 22 December 2017.  The patient had onset of weakness throughout the day, she denied any pain in the head or ear or jaw.  She has not had any alteration in hearing or change in taste.  The entire right side of the face became somewhat weak.  She went to the emergency room and was told she had Bell's palsy and was placed on prednisone and a course of Valtrex.  She is currently taking these medications.  She is using eyedrops in the eye, and she is careful to protect the cornea.  She is able to close the right eye if she tries hard.   The patient denies any problems with swallowing, she denies double vision or loss of vision.  She has not had any numbness or weakness of the arms or legs.  She has no numbness on the face.  She denies any balance changes or falls.  Has no problems controlling the bowels or the bladder.  She is sent to this office for further evaluation  REVIEW OF SYSTEMS: Full 14 system review of systems performed and notable only for those listed, all others are neg:  Constitutional: neg  Cardiovascular: neg Ear/Nose/Throat: neg  Skin: neg Eyes: neg Respiratory: neg Gastroitestinal: neg  Hematology/Lymphatic: neg  Endocrine: neg Musculoskeletal:neg Allergy/Immunology: neg Neurological: neg Psychiatric: neg Sleep : Snoring   ALLERGIES: No Known Allergies  HOME MEDICATIONS: Outpatient Medications Prior to Visit  Medication Sig Dispense Refill  . aspirin 81 MG chewable tablet Chew 81 mg by mouth daily.    Marland Kitchen atorvastatin (LIPITOR) 20 MG tablet Take 1 tablet (20 mg total) by mouth daily. 90 tablet 3  . alendronate (FOSAMAX) 70 MG tablet Take 1 tablet (70 mg total) by mouth every 7 (seven) days. Take with a full glass of water on an empty stomach. (Patient not taking: Reported on 04/13/2018) 4 tablet 11   No facility-administered medications prior to visit.     PAST MEDICAL HISTORY: Past Medical History:  Diagnosis Date  . Right-sided Bell's palsy 12/27/2017    PAST SURGICAL  HISTORY: Past Surgical History:  Procedure Laterality Date  . ABDOMINAL HYSTERECTOMY    . APPENDECTOMY    . TONSILLECTOMY      FAMILY HISTORY: Family History  Problem Relation Age of Onset  . Renal Disease Mother   . Seizures Father   . Renal Disease Father   . Cancer Neg Hx     SOCIAL HISTORY: Social History   Socioeconomic History  . Marital status: Married    Spouse name: Not on file  . Number of children: 2  . Years of education: 36  . Highest education level: Not on file  Occupational History  .  Occupation: Retired  Engineer, production  . Financial resource strain: Not on file  . Food insecurity:    Worry: Not on file    Inability: Not on file  . Transportation needs:    Medical: Not on file    Non-medical: Not on file  Tobacco Use  . Smoking status: Current Every Day Smoker    Packs/day: 0.50    Years: 50.00    Pack years: 25.00  . Smokeless tobacco: Current User    Types: Chew  Substance and Sexual Activity  . Alcohol use: No    Frequency: Never  . Drug use: No  . Sexual activity: Not on file  Lifestyle  . Physical activity:    Days per week: Not on file    Minutes per session: Not on file  . Stress: Not on file  Relationships  . Social connections:    Talks on phone: Not on file    Gets together: Not on file    Attends religious service: Not on file    Active member of club or organization: Not on file    Attends meetings of clubs or organizations: Not on file    Relationship status: Not on file  . Intimate partner violence:    Fear of current or ex partner: Not on file    Emotionally abused: Not on file    Physically abused: Not on file    Forced sexual activity: Not on file  Other Topics Concern  . Not on file  Social History Narrative   Lives with husband   Caffeine use: Coffee daily and diet soda   Right handed      PHYSICAL EXAM  Vitals:   04/13/18 1419  BP: (!) 142/72  Pulse: 80  Weight: 130 lb (59 kg)  Height:  (1.6 m)   Body mass index is 23.03 kg/m.  Generalized: Well developed, in no acute distress  Head: normocephalic and atraumatic,. Oropharynx benign  Neck: Supple,  carotid bruits vs transmitted cardiac murmur Cardiac: Regular rate rhythm, murmur audible Musculoskeletal: No deformity   Neurological examination   Mentation: Alert oriented to time, place, history taking. Attention span and concentration appropriate. Recent and remote memory intact.  Follows all commands speech and language fluent.   Cranial nerve II-XII:  Fundoscopic exam reveals sharp disc margins.Pupils were equal round reactive to light extraocular movements were full, visual field were full on confrontational test. Facial sensation and strength were normal. hearing was intact to finger rubbing bilaterally. Uvula tongue midline. head turning and shoulder shrug were normal and symmetric.Tongue protrusion into cheek strength was normal. Motor: normal bulk and tone, full strength in the BUE, BLE, fine finger movements normal, no pronator drift. No focal weakness Sensory: normal and symmetric to light touch, pinprick, and  Vibration in the upper and lower extremity  Coordination: finger-nose-finger, heel-to-shin bilaterally,  no dysmetria Reflexes: Symmetric upper and lower,  plantar responses were flexor bilaterally. Gait and Station: Rising up from seated position without assistance, normal stance,  moderate stride, good arm swing, smooth turning, able to perform tiptoe, and heel walking without difficulty. Tandem gait is steady  DIAGNOSTIC DATA (LABS, IMAGING, TESTING) - I reviewed patient records, labs, notes, testing and imaging myself where available.  Lab Results  Component Value Date   WBC 7.8 01/05/2018   HGB 14.0 01/05/2018   HCT 40.7 01/05/2018   MCV 95.3 01/05/2018   PLT 187.0 01/05/2018      Component Value Date/Time   NA 142 02/14/2018 0842   K 3.8 02/14/2018 0842   CL 105 02/14/2018 0842   CO2 30 02/14/2018 0842   GLUCOSE 87 02/14/2018 0842   BUN 14 02/14/2018 0842   CREATININE 0.82 02/14/2018 0842   CALCIUM 9.6 02/14/2018 0842   PROT 7.0 02/14/2018 0842   ALBUMIN 4.5 02/14/2018 0842   AST 14 02/14/2018 0842   ALT 14 02/14/2018 0842   ALKPHOS 43 02/14/2018 0842   BILITOT 0.6 02/14/2018 0842   Lab Results  Component Value Date   CHOL 199 02/14/2018   HDL 91.90 02/14/2018   LDLCALC 97 02/14/2018   TRIG 48.0 02/14/2018   CHOLHDL 2 02/14/2018   No results found for: HGBA1C Lab Results  Component Value Date    VITAMINB12 408 01/05/2018   Lab Results  Component Value Date   TSH 1.28 01/05/2018      ASSESSMENT AND PLAN  67 y.o. year old female  has a past medical history of Right-sided Bell's palsy (12/27/2017). and  Carotid stenosis.  Carotid Doppler ordered at her last visit shows 40-59% stenosis of the right internal carotid artery, the patient has distal occlusion of the left vertebral artery.  She has new complaint of snoring and has never had a sleep study.   Begin aspirin 0.81 daily  Continue Lipitor  daily We will get sleep study for snoring I explained in particular the risks and ramifications of untreated moderate to severe OSA, especially with respect to cardiovascular disease  including congestive heart failure, difficult to treat hypertension, cardiac arrhythmias, or stroke. Even type 2 diabetes has, in part, been linked to untreated OSA. Symptoms of untreated OSA include daytime sleepiness, memory problems, mood irritability and mood disorder such as depression and anxiety, lack of energy, as well as recurrent headaches, especially morning headaches. We talked about trying to maintain a healthy lifestyle in general, as well as the importance of weight control. I encouraged the patient to eat healthy, exercise daily and keep well hydrated, to keep a scheduled bedtime and wake time routine, to not skip any meals and eat healthy snacks in between meals F/U in 6 months Nilda Riggs, [facility], [facility], APRN  [facility] [address] [place], [place] [address] [phone]

## 2018-04-13 ENCOUNTER — Ambulatory Visit: Payer: Medicare HMO | Admitting: Nurse Practitioner

## 2018-04-13 ENCOUNTER — Encounter: Payer: Self-pay | Admitting: Nurse Practitioner

## 2018-04-13 VITALS — BP 142/72 | HR 80 | Ht 63.0 in | Wt 130.0 lb

## 2018-04-13 DIAGNOSIS — I6523 Occlusion and stenosis of bilateral carotid arteries: Secondary | ICD-10-CM

## 2018-04-13 DIAGNOSIS — R0683 Snoring: Secondary | ICD-10-CM

## 2018-04-13 DIAGNOSIS — Z720 Tobacco use: Secondary | ICD-10-CM | POA: Diagnosis not present

## 2018-04-13 NOTE — Patient Instructions (Addendum)
Begin aspirin 0.81 daily  continue Lipitor  daily We will get sleep study for snoring I explained in particular the risks and ramifications of untreated moderate to severe OSA, especially with respect to cardiovascular disease  including congestive heart failure, difficult to treat hypertension, cardiac arrhythmias, or stroke. Even type 2 diabetes has, in part, been linked to untreated OSA. Symptoms of untreated OSA include daytime sleepiness, memory problems, mood irritability and mood disorder such as depression and anxiety, lack of energy, as well as recurrent headaches, especially morning headaches. We talked about trying to maintain a healthy lifestyle in general, as well as the importance of weight control. I encouraged the patient to eat healthy, exercise daily and keep well hydrated, to keep a scheduled bedtime and wake time routine, to not skip any meals and eat healthy snacks in between meals

## 2018-04-13 NOTE — Progress Notes (Signed)
I have read the note, and I agree with the clinical assessment and plan.  Shashana Fullington K Draken Farrior   

## 2018-05-11 DIAGNOSIS — Z9181 History of falling: Secondary | ICD-10-CM | POA: Diagnosis not present

## 2018-05-11 DIAGNOSIS — Z720 Tobacco use: Secondary | ICD-10-CM | POA: Diagnosis not present

## 2018-05-11 DIAGNOSIS — Z8249 Family history of ischemic heart disease and other diseases of the circulatory system: Secondary | ICD-10-CM | POA: Diagnosis not present

## 2018-05-11 DIAGNOSIS — E785 Hyperlipidemia, unspecified: Secondary | ICD-10-CM | POA: Diagnosis not present

## 2018-05-11 DIAGNOSIS — Z823 Family history of stroke: Secondary | ICD-10-CM | POA: Diagnosis not present

## 2018-05-29 DIAGNOSIS — R69 Illness, unspecified: Secondary | ICD-10-CM | POA: Diagnosis not present

## 2018-06-07 ENCOUNTER — Ambulatory Visit (INDEPENDENT_AMBULATORY_CARE_PROVIDER_SITE_OTHER): Payer: Medicare HMO | Admitting: Neurology

## 2018-06-07 ENCOUNTER — Encounter: Payer: Self-pay | Admitting: Neurology

## 2018-06-07 VITALS — BP 161/96 | HR 63 | Ht 63.0 in | Wt 129.0 lb

## 2018-06-07 DIAGNOSIS — F17200 Nicotine dependence, unspecified, uncomplicated: Secondary | ICD-10-CM | POA: Diagnosis not present

## 2018-06-07 DIAGNOSIS — R69 Illness, unspecified: Secondary | ICD-10-CM | POA: Diagnosis not present

## 2018-06-07 DIAGNOSIS — R0681 Apnea, not elsewhere classified: Secondary | ICD-10-CM

## 2018-06-07 DIAGNOSIS — R0683 Snoring: Secondary | ICD-10-CM | POA: Diagnosis not present

## 2018-06-07 NOTE — Patient Instructions (Signed)
Thank you for choosing [facility] for your sleep related care!  It was nice to meet you today! I appreciate that you entrust me with your sleep related healthcare concerns. I hope, I was able to address at least some of your concerns today, and that I can help you feel reassured and also get better.    Here is what we discussed today and what we came up with as our plan for you:    Based on your symptoms and your exam I believe you are at risk for obstructive sleep apnea (aka OSA), and I think we should proceed with a sleep study. You prefer a home sleep test as you take care of your elderly father.  Even, if you have mild OSA, I may want you to consider treatment with CPAP, as treatment of even borderline or mild sleep apnea can result and improvement of symptoms such as sleep disruption, daytime sleepiness, nighttime bathroom breaks, restless leg symptoms, improvement of headache syndromes, even improved mood disorder.   Please remember, the long-term risks and ramifications of untreated moderate to severe obstructive sleep apnea are: increased Cardiovascular disease, including congestive heart failure, stroke, difficult to control hypertension, treatment resistant obesity, arrhythmias, especially irregular heartbeat commonly known as A. Fib. (atrial fibrillation); even type 2 diabetes has been linked to untreated OSA.   Sleep apnea can cause disruption of sleep and sleep deprivation in most cases, which, in turn, can cause recurrent headaches, problems with memory, mood, concentration, focus, and vigilance. Most people with untreated sleep apnea report excessive daytime sleepiness, which can affect their ability to drive. Please do not drive if you feel sleepy. Patients with sleep apnea developed difficulty initiating and maintaining sleep (aka insomnia).   Having sleep apnea may increase your risk for other sleep disorders, including involuntary behaviors sleep such as sleep  terrors, sleep talking, sleepwalking.    Having sleep apnea can also increase your risk for restless leg syndrome and leg movements at night.   Please note that untreated obstructive sleep apnea may carry additional perioperative morbidity. Patients with significant obstructive sleep apnea (typically, in the moderate to severe degree) should receive, if possible, perioperative PAP (positive airway pressure) therapy and the surgeons and particularly the anesthesiologists should be informed of the diagnosis and the severity of the sleep disordered breathing.   I will likely see you back after your sleep study to go over the test results and where to go from there. We will call you after your sleep study to advise about the results (most likely, you will hear from DivideKristen, my nurse) and to set up an appointment at the time, as necessary.    Our sleep lab administrative assistant will call you to schedule your sleep study and give you further instructions, regarding the check in process for the sleep study, arrival time, what to bring, when you can expect to leave after the study, etc., and to answer any other logistical questions you may have. If you don't hear back from her by about 2 weeks from now, please feel free to call her direct line at [phone] or you can call our general clinic number, or email us through My Chart.

## 2018-06-07 NOTE — Progress Notes (Signed)
Subjective:    Patient ID: Mikayla Ross is a 67 y.o. female.  HPI     Huston Foley, MD, PhD [facility] [address], [address] [address] [place], [place] [address]  Dear Mikayla Ross,   I saw your patient, [name], upon your kind request, in my today for initial consultation of her sleep disorder, in particular, concern for underlying obstructive sleep apnea. Patient is unaccompanied today. As you know, [name] is a 67 year old right-handed woman with an underlying medical history of Bell's palsy, hyperlipidemia, and smoking, who reports  snoring, her husband reports that she has pauses in her breathing and that her snoring is loud at times. She has no family history of sleep apnea. She denies any telltale daytime somnolence. She has no night to night nocturia. She has no morning headaches. Bedtime is around 6 or 7 PM, rise time is around 6 or 7 AM. She spends the night at her father's house who lives alone, he is visually impaired and nearly 66 years old and she helps out. She is an only child. She smokes about a pack per day, maybe less, is not intending to quit. She drinks soda throughout the day, 2-3 bottles per husband's report and coffee 1 cup in the morning. Her Epworth sleepiness score is 3 out of 24, fatigue score is 27 out of 63. She had a tonsillectomy.   Her Past Medical History Is Significant For: Past Medical History:  Diagnosis Date  . Right-sided Bell's palsy 12/27/2017    Her Past Surgical History Is Significant For: Past Surgical History:  Procedure Laterality Date  . ABDOMINAL HYSTERECTOMY    . APPENDECTOMY    . TONSILLECTOMY      Her Family History Is Significant For: Family History  Problem Relation Age of Onset  . Renal Disease Mother   . Seizures Father   . Renal Disease Father   . Cancer Neg Hx     Her Social History Is Significant For: Social History   Socioeconomic History  . Marital status: Married    Spouse name: Not on  file  . Number of children: 2  . Years of education: 16  . Highest education level: Not on file  Occupational History  . Occupation: Retired  Engineer, production  . Financial resource strain: Not on file  . Food insecurity:    Worry: Not on file    Inability: Not on file  . Transportation needs:    Medical: Not on file    Non-medical: Not on file  Tobacco Use  . Smoking status: Current Every Day Smoker    Packs/day: 0.50    Years: 50.00    Pack years: 25.00  . Smokeless tobacco: Current User    Types: Chew  Substance and Sexual Activity  . Alcohol use: No    Frequency: Never  . Drug use: No  . Sexual activity: Not on file  Lifestyle  . Physical activity:    Days per week: Not on file    Minutes per session: Not on file  . Stress: Not on file  Relationships  . Social connections:    Talks on phone: Not on file    Gets together: Not on file    Attends religious service: Not on file    Active member of club or organization: Not on file    Attends meetings of clubs or organizations: Not on file    Relationship status: Not on file  Other Topics Concern  .  Not on file  Social History Narrative   Lives with husband   Caffeine use: Coffee daily and diet soda   Right handed     Her Allergies Are:  No Known Allergies:   Her Current Medications Are:  Outpatient Encounter Medications as of 06/07/2018  Medication Sig  . [DISCONTINUED] alendronate (FOSAMAX) 70 MG tablet Take 1 tablet (70 mg total) by mouth every 7 (seven) days. Take with a full glass of water on an empty stomach. (Patient not taking: Reported on 04/13/2018)  . [DISCONTINUED] aspirin 81 MG chewable tablet Chew 81 mg by mouth daily.  . [DISCONTINUED] atorvastatin (LIPITOR) 20 MG tablet Take 1 tablet (20 mg total) by mouth daily.   No facility-administered encounter medications on file as of 06/07/2018.   :  Review of Systems:  Out of a complete 14 point review of systems, all are reviewed and negative with the  exception of these symptoms as listed below:  Review of Systems  Neurological:       Pt presents today to discuss her sleep. Pt does endorse snoring but has never had a sleep study. Pt reports that she does not take aspirin or lipitor.  Epworth Sleepiness Scale 0= would never doze 1= slight chance of dozing 2= moderate chance of dozing 3= high chance of dozing  Sitting and reading: 0 Watching TV: 3 Sitting inactive in a public place (ex. Theater or meeting): 0 As a passenger in a car for an hour without a break: 0 Lying down to rest in the afternoon: 0 Sitting and talking to someone: 0 Sitting quietly after lunch (no alcohol): 0 In a car, while stopped in traffic: 0 Total: 3     Objective:  Neurological Exam  Physical Exam Physical Examination:   Vitals:   06/07/18 1337  BP: (!) 161/96  Pulse: 63   General Examination: The patient is a very pleasant 67 y.o. female in no acute distress. She appears well-developed and well-nourished and well groomed.   HEENT: Normocephalic, atraumatic, pupils are equal, round and reactive to light and accommodation. Extraocular tracking is good without limitation to gaze excursion or nystagmus noted. Normal smooth pursuit is noted. Hearing is grossly intact. Face is symmetric with normal facial animation and normal facial sensation. Speech is clear with no dysarthria noted. There is no hypophonia. There is no lip, neck/head, jaw or voice tremor. Neck is supple with full range of passive and active motion. There are no carotid bruits on auscultation. Oropharynx exam reveals: moderate mouth dryness, adequate dental hygiene and mild airway crowding, due to smaller airway entry. Mallampati is class II. Tonsils absent. Neck circumference is 12 inches. She has a mild overbite.    Chest: Clear to auscultation without wheezing, rhonchi or crackles noted.  Heart: S1+S2+0, regular and normal without murmurs, rubs or gallops noted.   Abdomen: Soft,  non-tender and non-distended with normal bowel sounds appreciated on auscultation.  Extremities: There is no pitting edema in the distal lower extremities bilaterally. Pedal pulses are intact.  Skin: Warm and dry without trophic changes noted.  Musculoskeletal: exam reveals no obvious joint deformities, tenderness or joint swelling or erythema.   Neurologically:  Mental status: The patient is awake, alert and oriented in all 4 spheres. Her immediate and remote memory, attention, language skills and fund of knowledge are appropriate. There is no evidence of aphasia, agnosia, apraxia or anomia. Speech is clear with normal prosody and enunciation. Thought process is linear. Mood is normal and affect is normal.  Cranial nerves II - XII are as described above under HEENT exam. In addition: shoulder shrug is normal with equal shoulder height noted. Motor exam: Normal bulk, strength and tone is noted. There is no drift, tremor or rebound. Romberg is negative. Fine motor skills and coordination: grossly intact.  Cerebellar testing: No dysmetria or intention tremor. There is no truncal or gait ataxia.  Sensory exam: intact to light touch in the upper and lower extremities.  Gait, station and balance: She stands easily. No veering to one side is noted. No leaning to one side is noted. Posture is age-appropriate and stance is narrow based. Gait shows normal stride length and normal pace. No problems turning are noted. Tandem walk is somewhat challenging for her.  Assessment and Plan:  In summary, Yanice Charna ElizabethG Linan is a very pleasant 67 y.o.-year old female with an underlying history of Bell's palsy, and smoking, who is reported to have snoring and breathing pauses while asleep per husband's report. She would be willing to test for sleep apnea but prefers a home sleep test as she has to stay overnight at her father's place who is nearly 66 years old and blind.  I had a long chat with the patient and her husband  about my findings and the diagnosis of OSA, its prognosis and treatment options. We talked about medical treatments, surgical interventions and non-pharmacological approaches. I explained in particular the risks and ramifications of untreated moderate to severe OSA, especially with respect to developing cardiovascular disease down the Road, including congestive heart failure, difficult to treat hypertension, cardiac arrhythmias, or stroke. Even type 2 diabetes has, in part, been linked to untreated OSA. Symptoms of untreated OSA include daytime sleepiness, memory problems, mood irritability and mood disorder such as depression and anxiety, lack of energy, as well as recurrent headaches, especially morning headaches. We talked about smoking cessation and trying to maintain a healthy lifestyle in general, as well as the importance of weight control. She is not planning on smoking cessation and cutting back. I encouraged the patient to eat healthy, exercise daily and keep well hydrated, to keep a scheduled bedtime and wake time routine, to not skip any meals and eat healthy snacks in between meals. I advised the patient not to drive when feeling sleepy. I recommended the following at this time: home sleep test.  I explained the sleep test procedure to the patient and also outlined possible surgical and non-surgical treatment options of OSA, including the use of CPAP. She would be willing to try CPAP if the need arises.  I plan to see her back after her sleep test. I answered all their questions today and the patient and her husband were in agreement.  Thank you very much for allowing me to participate in the care of this nice patient. If I can be of any further assistance to you please do not hesitate to talk to me.  Sincerely,   Huston FoleySaima Cristine Daw, MD, PhD

## 2018-06-21 ENCOUNTER — Telehealth: Payer: Self-pay | Admitting: *Deleted

## 2018-06-21 NOTE — Telephone Encounter (Signed)
Received comprehensive chart note from [employer]; forwarded to provider/SLS 07/23

## 2018-06-29 ENCOUNTER — Encounter: Payer: Medicare HMO | Admitting: Neurology

## 2018-06-30 ENCOUNTER — Telehealth: Payer: Self-pay

## 2018-06-30 NOTE — Telephone Encounter (Signed)
Patient came and picked up HST on 06-29-18. She came in today and sad it did not work. I asked her to show me what happened and she couldn't. She stated that she does not want to pursue this anymore. I tried to explain the importance of treating sleep apnea if needed. She feels she does not have it and again does not want pursue with it. I will cancel her appt and not charge for study due to no data.

## 2018-08-17 ENCOUNTER — Ambulatory Visit: Payer: Medicare HMO | Admitting: Family Medicine

## 2018-08-25 ENCOUNTER — Ambulatory Visit: Payer: Medicare HMO | Admitting: Family Medicine

## 2018-09-01 ENCOUNTER — Ambulatory Visit (INDEPENDENT_AMBULATORY_CARE_PROVIDER_SITE_OTHER): Payer: Medicare HMO | Admitting: Family Medicine

## 2018-09-01 ENCOUNTER — Encounter: Payer: Self-pay | Admitting: Family Medicine

## 2018-09-01 VITALS — BP 128/82 | HR 76 | Temp 98.4°F | Ht 63.0 in | Wt 130.2 lb

## 2018-09-01 DIAGNOSIS — Z720 Tobacco use: Secondary | ICD-10-CM

## 2018-09-01 DIAGNOSIS — R413 Other amnesia: Secondary | ICD-10-CM | POA: Diagnosis not present

## 2018-09-01 DIAGNOSIS — Z23 Encounter for immunization: Secondary | ICD-10-CM

## 2018-09-01 DIAGNOSIS — I6523 Occlusion and stenosis of bilateral carotid arteries: Secondary | ICD-10-CM

## 2018-09-01 DIAGNOSIS — Z9189 Other specified personal risk factors, not elsewhere classified: Secondary | ICD-10-CM | POA: Diagnosis not present

## 2018-09-01 LAB — COMPREHENSIVE METABOLIC PANEL
ALK PHOS: 46 U/L (ref 39–117)
ALT: 11 U/L (ref 0–35)
AST: 14 U/L (ref 0–37)
Albumin: 4.4 g/dL (ref 3.5–5.2)
BUN: 14 mg/dL (ref 6–23)
CHLORIDE: 103 meq/L (ref 96–112)
CO2: 33 meq/L — AB (ref 19–32)
Calcium: 9.9 mg/dL (ref 8.4–10.5)
Creatinine, Ser: 0.73 mg/dL (ref 0.40–1.20)
GFR: 84.74 mL/min (ref >60.00)
GLUCOSE: 73 mg/dL (ref 70–99)
POTASSIUM: 4.5 meq/L (ref 3.5–5.1)
SODIUM: 141 meq/L (ref 135–145)
Total Bilirubin: 0.5 mg/dL (ref 0.2–1.2)
Total Protein: 6.7 g/dL (ref 6.0–8.3)

## 2018-09-01 LAB — LIPID PANEL
CHOL/HDL RATIO: 3
Cholesterol: 229 mg/dL — ABNORMAL HIGH (ref 0–200)
HDL: 66.9 mg/dL (ref >39.00)
LDL CALC: 138 mg/dL — AB (ref 0–99)
NONHDL: 162.08
Triglycerides: 122 mg/dL (ref 0.0–149.0)
VLDL: 24.4 mg/dL (ref 0.0–40.0)

## 2018-09-01 MED ORDER — ALENDRONATE SODIUM 70 MG PO TABS: 70.0000 mg | ORAL_TABLET | ORAL | 3 refills | Status: DC

## 2018-09-01 MED ORDER — ATORVASTATIN CALCIUM 20 MG PO TABS: 20.0000 mg | ORAL_TABLET | Freq: Every day | ORAL | 3 refills | Status: DC

## 2018-09-01 NOTE — Progress Notes (Signed)
Pre visit review using our clinic review tool, if applicable. No additional management support is needed unless otherwise documented below in the visit note. 

## 2018-09-01 NOTE — Progress Notes (Signed)
Chief Complaint  Patient presents with  . Follow-up    Subjective: Hyperlipidemia Patient presents for Hyperlipidemia follow up. Currently taking Lipitor and compliance with treatment thus far has been good. She denies myalgias. She is adhering to a healthy diet. Exercise: no routine exercise The patient is not known to have coexisting coronary artery disease.  Hx of increased hip fx (>3%). Was rx'd Fosamax, started taking but ran out. It made her urinate more freq but tolerated well otherwise. Is physically active, but no routine exercise. Is not taking Ca or VitD routinely. Eats a healthy.   Continued issues with memory loss. She is wishing to see Dr. Loistine Simas of Neuro with [facility]. Her husband Dorene Sorrow sees her and is impressed with her care. Workup thus far has been neg.   ROS: Heart: Denies chest pain or palpitations Lungs: Denies SOB or cough  Past Medical History:  Diagnosis Date  . Right-sided Bell's palsy 12/27/2017    Objective: BP 128/82 (BP Location: Left Arm, Patient Position: Sitting, Cuff Size: Normal)   Pulse 76   Temp 98.4 F (36.9 C) (Oral)   Ht 5\' 3"  (1.6 m)   Wt 130 lb 4 oz (59.1 kg)   SpO2 97%   BMI 23.07 kg/m  General: Awake, appears stated age HEENT: MMM Heart: RRR, no LE edema, no bruits Lungs: CTAB, no rales, wheezes or rhonchi. No accessory muscle use Psych: Age appropriate judgment and insight, normal affect and mood  Assessment and Plan: Bilateral carotid artery stenosis - Plan: atorvastatin (LIPITOR) 20 MG tablet, Lipid panel  Tobacco abuse - Plan: Comprehensive metabolic panel  High risk for hip fracture - Plan: Comprehensive metabolic panel  Need for influenza vaccination - Plan: Flu vaccine HIGH DOSE PF (Fluzone High dose)  Memory loss or impairment - Plan: Ambulatory referral to Neurology  Cont Statin, let me know if she runs out in future. Ck labs. Go back on Fosamax. Wt bearing exercise rec'd. Vit D and Ca rec'd. Refer to Neuro for  memory issue at pt request.  F/u 6 mo for CPE or prn. The patient voiced understanding and agreement to the plan.  Jilda Roche Dawson, DO 09/01/18  1:49 PM

## 2018-09-01 NOTE — Patient Instructions (Addendum)
Continue on Calcium and Vit D.   Give [place] 2-3 business days to get the results of your labs back.   Aim to do some physical exertion for 150 minutes per week. This is typically divided into 5 days per week, 30 minutes per day. The activity should be enough to get your heart rate up. Anything is better than nothing if you have time constraints.  Let us know if you need anything.

## 2018-09-05 ENCOUNTER — Encounter: Payer: Self-pay | Admitting: Family Medicine

## 2018-10-03 ENCOUNTER — Other Ambulatory Visit: Payer: Self-pay | Admitting: Family Medicine

## 2018-10-03 MED ORDER — NICOTINE 7 MG/24HR TD PT24: 7.0000 mg | MEDICATED_PATCH | Freq: Every day | TRANSDERMAL | 0 refills | Status: DC

## 2018-10-03 MED ORDER — NICOTINE 14 MG/24HR TD PT24: 14.0000 mg | MEDICATED_PATCH | Freq: Every day | TRANSDERMAL | 0 refills | Status: DC

## 2018-10-03 MED ORDER — NICOTINE 21 MG/24HR TD PT24: 21.0000 mg | MEDICATED_PATCH | Freq: Every day | TRANSDERMAL | 0 refills | Status: DC

## 2018-10-03 NOTE — Progress Notes (Signed)
Spoke with patient regarding nicotine patches for tobacco cessation.  Called in.  Recommend follow-up in 6 weeks to assess progress.

## 2018-10-04 NOTE — Progress Notes (Deleted)
[facility]  PATIENT: Mikayla Ross DOB: 10-25-1951   REASON FOR VISIT: Follow-up for right Bell's palsy, carotid stenosis HISTORY FROM: Patient and  husband   HISTORY OF PRESENT ILLNESS:UPDATE 04/13/18 CM [name], 67 year old female returns for follow-up with history of right Bell's palsy which occurred in January 2019.   Her facial weakness has recovered nicely.  She has no problems swallowing any numbness or weakness of arms face or leg she denies any difficulty with bowel or bladder control.  She has had no visual symptoms.  Carotid Doppler ordered at her last visit shows 40-59% stenosis of the right internal carotid artery, the patient has distal occlusion of the left vertebral artery, the right vertebral may be the dominant artery.  She was asked to go on a low-dose aspirin however patient claims she never got a phone message.  Carotid Doppler to be rechecked in 1 year.  Recent labs with primary care to include lipid panel CMP B12 TSH and CBC all within normal limits.  LDL 97 she was placed on Lipitor by her primary care.  Patient also complains of snoring and her husband reports she snores loudly.  She has never had a sleep study.  Blood pressure in the office today 142/72.  She returns for reevaluation   12/28/17 KWMs. Mikayla Ross is a 66 year old right-handed white female with a history of onset of right facial weakness that came on spontaneously around 22 December 2017.  The patient had onset of weakness throughout the day, she denied any pain in the head or ear or jaw.  She has not had any alteration in hearing or change in taste.  The entire right side of the face became somewhat weak.  She went to the emergency room and was told she had Bell's palsy and was placed on prednisone and a course of Valtrex.  She is currently taking these medications.  She is using eyedrops in the eye, and she is careful to protect the cornea.  She is able to close the right eye if she tries hard.   The patient denies any problems with swallowing, she denies double vision or loss of vision.  She has not had any numbness or weakness of the arms or legs.  She has no numbness on the face.  She denies any balance changes or falls.  Has no problems controlling the bowels or the bladder.  She is sent to this office for further evaluation  REVIEW OF SYSTEMS: Full 14 system review of systems performed and notable only for those listed, all others are neg:  Constitutional: neg  Cardiovascular: neg Ear/Nose/Throat: neg  Skin: neg Eyes: neg Respiratory: neg Gastroitestinal: neg  Hematology/Lymphatic: neg  Endocrine: neg Musculoskeletal:neg Allergy/Immunology: neg Neurological: neg Psychiatric: neg Sleep : Snoring   ALLERGIES: No Known Allergies  HOME MEDICATIONS: Outpatient Medications Prior to Visit  Medication Sig Dispense Refill  . alendronate (FOSAMAX) 70 MG tablet Take 1 tablet (70 mg total) by mouth every 7 (seven) days. Take with a full glass of water on an empty stomach. 12 tablet 3  . atorvastatin (LIPITOR) 20 MG tablet Take 1 tablet (20 mg total) by mouth daily. 90 tablet 3  . [START ON 11/14/2018] nicotine (NICODERM CQ - DOSED IN MG/24 HOURS) 14 mg/24hr patch Place 1 patch (14 mg total) onto the skin daily. 14 patch 0  . nicotine (NICODERM CQ - DOSED IN MG/24 HOURS) 21 mg/24hr patch Place 1 patch (21 mg total) onto the skin daily. 42  patch 0  . [START ON 11/28/2018] nicotine (NICODERM CQ - DOSED IN MG/24 HR) 7 mg/24hr patch Place 1 patch (7 mg total) onto the skin daily. 14 patch 0   No facility-administered medications prior to visit.     PAST MEDICAL HISTORY: Past Medical History:  Diagnosis Date  . Right-sided Bell's palsy 12/27/2017    PAST SURGICAL HISTORY: Past Surgical History:  Procedure Laterality Date  . ABDOMINAL HYSTERECTOMY    . APPENDECTOMY    . TONSILLECTOMY      FAMILY HISTORY: Family History  Problem Relation Age of Onset  . Renal Disease Mother    . Seizures Father   . Renal Disease Father   . Cancer Neg Hx     SOCIAL HISTORY: Social History   Socioeconomic History  . Marital status: Married    Spouse name: Not on file  . Number of children: 2  . Years of education: 74  . Highest education level: Not on file  Occupational History  . Occupation: Retired  Engineer, production  . Financial resource strain: Not on file  . Food insecurity:    Worry: Not on file    Inability: Not on file  . Transportation needs:    Medical: Not on file    Non-medical: Not on file  Tobacco Use  . Smoking status: Current Every Day Smoker    Packs/day: 0.50    Years: 50.00    Pack years: 25.00  . Smokeless tobacco: Current User    Types: Chew  Substance and Sexual Activity  . Alcohol use: No    Frequency: Never  . Drug use: No  . Sexual activity: Not on file  Lifestyle  . Physical activity:    Days per week: Not on file    Minutes per session: Not on file  . Stress: Not on file  Relationships  . Social connections:    Talks on phone: Not on file    Gets together: Not on file    Attends religious service: Not on file    Active member of club or organization: Not on file    Attends meetings of clubs or organizations: Not on file    Relationship status: Not on file  . Intimate partner violence:    Fear of current or ex partner: Not on file    Emotionally abused: Not on file    Physically abused: Not on file    Forced sexual activity: Not on file  Other Topics Concern  . Not on file  Social History Narrative   Lives with husband   Caffeine use: Coffee daily and diet soda   Right handed      PHYSICAL EXAM  There were no vitals filed for this visit. There is no height or weight on file to calculate BMI.  Generalized: Well developed, in no acute distress  Head: normocephalic and atraumatic,. Oropharynx benign  Neck: Supple,  carotid bruits vs transmitted cardiac murmur Cardiac: Regular rate rhythm, murmur  audible Musculoskeletal: No deformity   Neurological examination   Mentation: Alert oriented to time, place, history taking. Attention span and concentration appropriate. Recent and remote memory intact.  Follows all commands speech and language fluent.   Cranial nerve II-XII: Fundoscopic exam reveals sharp disc margins.Pupils were equal round reactive to light extraocular movements were full, visual field were full on confrontational test. Facial sensation and strength were normal. hearing was intact to finger rubbing bilaterally. Uvula tongue midline. head turning and shoulder shrug were normal and  symmetric.Tongue protrusion into cheek strength was normal. Motor: normal bulk and tone, full strength in the BUE, BLE, fine finger movements normal, no pronator drift. No focal weakness Sensory: normal and symmetric to light touch, pinprick, and  Vibration in the upper and lower extremity  Coordination: finger-nose-finger, heel-to-shin bilaterally, no dysmetria Reflexes: Symmetric upper and lower,  plantar responses were flexor bilaterally. Gait and Station: Rising up from seated position without assistance, normal stance,  moderate stride, good arm swing, smooth turning, able to perform tiptoe, and heel walking without difficulty. Tandem gait is steady  DIAGNOSTIC DATA (LABS, IMAGING, TESTING) - I reviewed patient records, labs, notes, testing and imaging myself where available.  Lab Results  Component Value Date   WBC 7.8 01/05/2018   HGB 14.0 01/05/2018   HCT 40.7 01/05/2018   MCV 95.3 01/05/2018   PLT 187.0 01/05/2018      Component Value Date/Time   NA 141 09/01/2018 1349   K 4.5 09/01/2018 1349   CL 103 09/01/2018 1349   CO2 33 (H) 09/01/2018 1349   GLUCOSE 73 09/01/2018 1349   BUN 14 09/01/2018 1349   CREATININE 0.73 09/01/2018 1349   CALCIUM 9.9 09/01/2018 1349   PROT 6.7 09/01/2018 1349   ALBUMIN 4.4 09/01/2018 1349   AST 14 09/01/2018 1349   ALT 11 09/01/2018 1349    ALKPHOS 46 09/01/2018 1349   BILITOT 0.5 09/01/2018 1349   Lab Results  Component Value Date   CHOL 229 (H) 09/01/2018   HDL 66.90 09/01/2018   LDLCALC 138 (H) 09/01/2018   TRIG 122.0 09/01/2018   CHOLHDL 3 09/01/2018   No results found for: HGBA1C Lab Results  Component Value Date   VITAMINB12 408 01/05/2018   Lab Results  Component Value Date   TSH 1.28 01/05/2018      ASSESSMENT AND PLAN  67 y.o. year old female  has a past medical history of Right-sided Bell's palsy (12/27/2017). and  Carotid stenosis.  Carotid Doppler ordered at her last visit shows 40-59% stenosis of the right internal carotid artery, the patient has distal occlusion of the left vertebral artery.  She has new complaint of snoring and has never had a sleep study.   Begin aspirin 0.81 daily  Continue Lipitor 20mg  daily We will get sleep study for snoring I explained in particular the risks and ramifications of untreated moderate to severe OSA, especially with respect to cardiovascular disease  including congestive heart failure, difficult to treat hypertension, cardiac arrhythmias, or stroke. Even type 2 diabetes has, in part, been linked to untreated OSA. Symptoms of untreated OSA include daytime sleepiness, memory problems, mood irritability and mood disorder such as depression and anxiety, lack of energy, as well as recurrent headaches, especially morning headaches. We talked about trying to maintain a healthy lifestyle in general, as well as the importance of weight control. I encouraged the patient to eat healthy, exercise daily and keep well hydrated, to keep a scheduled bedtime and wake time routine, to not skip any meals and eat healthy snacks in between meals F/U in 6 months Nilda Riggs, [facility], [facility], APRN  [facility] [address], [address] [place], [place] [address] [phone]

## 2018-10-05 ENCOUNTER — Ambulatory Visit: Payer: Medicare HMO | Admitting: Nurse Practitioner

## 2018-10-05 ENCOUNTER — Telehealth: Payer: Self-pay | Admitting: *Deleted

## 2018-10-05 ENCOUNTER — Encounter: Payer: Self-pay | Admitting: Nurse Practitioner

## 2018-10-05 NOTE — Telephone Encounter (Signed)
Patient was no show for follow up with NP today.  

## 2018-11-14 ENCOUNTER — Ambulatory Visit: Payer: Medicare HMO | Admitting: Family Medicine

## 2018-11-15 ENCOUNTER — Encounter: Payer: Self-pay | Admitting: Neurology

## 2018-12-07 DIAGNOSIS — H524 Presbyopia: Secondary | ICD-10-CM | POA: Diagnosis not present

## 2018-12-07 DIAGNOSIS — H269 Unspecified cataract: Secondary | ICD-10-CM | POA: Diagnosis not present

## 2018-12-07 DIAGNOSIS — H52209 Unspecified astigmatism, unspecified eye: Secondary | ICD-10-CM | POA: Diagnosis not present

## 2018-12-07 DIAGNOSIS — H5203 Hypermetropia, bilateral: Secondary | ICD-10-CM | POA: Diagnosis not present

## 2018-12-31 ENCOUNTER — Telehealth: Payer: Self-pay | Admitting: Neurology

## 2018-12-31 NOTE — Telephone Encounter (Signed)
I tried to call the patient, unable to leave message.  I got a computer reminder to call about rechecking a carotid Doppler study, last study showed 55% stenosis of the right internal carotid artery.  If the patient is amenable to having the study rechecked, they are to let me know.  We will try to call back next week.

## 2019-01-02 NOTE — Telephone Encounter (Signed)
Attempted to reach the pt and discuss message from Dr. Anne Hahn. Pt was not available and received a message stating "mail box is full". Will attempt at a later time.

## 2019-01-03 ENCOUNTER — Telehealth: Payer: Self-pay | Admitting: Neurology

## 2019-01-03 NOTE — Telephone Encounter (Signed)
Error.  It appears that the patient has been referred to [facility] neurology, carotid artery follow-up can occur through their office.

## 2019-01-03 NOTE — Telephone Encounter (Signed)
I was able to find a alternative contact number listed on the Pt's DPR. [phone]) I contacted this # and lvm requesting a call back to discuss results.

## 2019-01-03 NOTE — Telephone Encounter (Signed)
Attempted to reach the pt and discuss message from Dr. Anne Hahn. Pt was not available and received a message stating "mail box is full".

## 2019-01-04 NOTE — Telephone Encounter (Signed)
Requested a call back to review results on mobile # listed. Attempted home # and was advised "mail box was full and could not accept new messages".  

## 2019-01-05 NOTE — Telephone Encounter (Signed)
I called the patient, left a message, if the patient is amenable to having a carotid Doppler study, she is to call our office and we will get a carotid Doppler set up to follow-up on the prior study done 1 year ago.

## 2019-01-05 NOTE — Telephone Encounter (Signed)
Requested a call back to review results on mobile # listed. Attempted home # and was advised "mail box was full and could not accept new messages".

## 2019-01-16 ENCOUNTER — Other Ambulatory Visit: Payer: Self-pay | Admitting: Podiatrist

## 2019-01-16 ENCOUNTER — Ambulatory Visit (INDEPENDENT_AMBULATORY_CARE_PROVIDER_SITE_OTHER): Payer: Medicare HMO

## 2019-01-16 ENCOUNTER — Ambulatory Visit: Payer: Medicare HMO | Admitting: Podiatrist

## 2019-01-16 ENCOUNTER — Encounter: Payer: Self-pay | Admitting: Podiatrist

## 2019-01-16 VITALS — BP 148/89 | HR 74

## 2019-01-16 DIAGNOSIS — L84 Corns and callosities: Secondary | ICD-10-CM

## 2019-01-16 DIAGNOSIS — M2041 Other hammer toe(s) (acquired), right foot: Secondary | ICD-10-CM

## 2019-01-16 DIAGNOSIS — M79671 Pain in right foot: Secondary | ICD-10-CM

## 2019-01-16 DIAGNOSIS — M79672 Pain in left foot: Secondary | ICD-10-CM

## 2019-01-16 DIAGNOSIS — M2042 Other hammer toe(s) (acquired), left foot: Secondary | ICD-10-CM

## 2019-01-16 DIAGNOSIS — M216X9 Other acquired deformities of unspecified foot: Secondary | ICD-10-CM

## 2019-01-16 NOTE — Patient Instructions (Signed)

## 2019-01-16 NOTE — Progress Notes (Signed)
  Chief Complaint  Patient presents with  . Callouses    Bilateral plantar callouses that are painful  . Hammer Toe    2-5 digits bilateral     HPI: Patient is 68 y.o. female who presents today for calluses plantar feet bilateral feet and hammertoes bilateral feet.  She relates pain with ambulation and in shoes and states that she trims them the calluses every so often but they come back.   Review of Systems  DATA OBTAINED: from patient  GENERAL: Feels well no fevers, no fatigue, no changes in appetite SKIN: No itching, no rashes, no open wounds EYES: No eye pain,no redness, no discharge EARS: No earache,no ringing of ears, NOSE: No congestion, no drainage, no bleeding  MOUTH/THROAT: No mouth pain, No sore throat, No difficulty chewing or swallowing  RESPIRATORY: No cough, no wheezing, no SOB CARDIAC: No chest pain,no heart palpitations, GI: No abdominal pain, No Nausea, no vomiting, no diarrhea, no heartburn or no reflux  GU: No dysuria, no increased frequency or urgency MUSCULOSKELETAL: No unrelieved bone/joint pain,  NEUROLOGIC: Awake, alert, appropriate to situation, No change in mental status. PSYCHIATRIC: No overt anxiety or sadness.No behavior issue.      Physical Exam  GENERAL APPEARANCE: Alert, conversant. Appropriately groomed. No acute distress.  VASCULAR: Pedal pulses palpable DP and PT bilateral.  Capillary refill time is immediate to all digits,  Proximal to distal cooling it warm to warm.  Digital hair growth is present bilateral  NEUROLOGIC: sensation is intact epicritically and protectively to 5.07 monofilament at 5/5 sites bilateral.  Light touch is intact bilateral, vibratory sensation intact bilateral, achilles tendon reflex is intact bilateral.  MUSCULOSKELETAL: acceptable muscle strength, tone and stability bilateral.  Hammertoe deformity digits 2, 3, 4, 5 are noted bilateral.  Splaying of the second and third digits especially on the left foot is noted.   Mild bunion deformity is also present bilateral. DERMATOLOGIC: Hyperkeratotic lesions and large calluses are present submetatarsal 1 and 5 bilateral feet she has a pinch callus on the medial aspect of the left hallux.  Digital nails are elongated and uncomfortable- they are discolored and brittle    Xray evaluation:  3 views bilateral feet obtained.  signifigant hammertoe deformity present 1-5 bilateral.  Bunion present bilateral,  Fifth metatarsal head partial resection appears to have been performed on the left foot.  No sign of fracture or dislocation   Assessment   Prominent metatarsal head 1 and 5 bilateral with hammertoe formation Callus x 5 Symptomatic mycotic nails  Plan debridment of calluses and toenails carried out-  Recommended orthotics for long term-  She will consider.  She will be seen back as needed for follow up.

## 2019-01-27 ENCOUNTER — Other Ambulatory Visit: Payer: Self-pay | Admitting: Family Medicine

## 2019-01-27 ENCOUNTER — Other Ambulatory Visit: Payer: Self-pay | Admitting: Hematology & Oncology

## 2019-01-27 DIAGNOSIS — Z1231 Encounter for screening mammogram for malignant neoplasm of breast: Secondary | ICD-10-CM

## 2019-01-30 ENCOUNTER — Encounter

## 2019-01-30 ENCOUNTER — Ambulatory Visit: Payer: Medicare HMO | Admitting: Neurology

## 2019-02-21 ENCOUNTER — Ambulatory Visit: Payer: Medicare HMO

## 2019-03-03 ENCOUNTER — Encounter: Payer: Medicare HMO | Admitting: Family Medicine

## 2019-03-23 ENCOUNTER — Encounter: Payer: Self-pay | Admitting: Neurology

## 2019-04-03 ENCOUNTER — Encounter: Payer: Self-pay | Admitting: Neurology

## 2019-04-12 ENCOUNTER — Ambulatory Visit: Payer: Medicare HMO | Admitting: Neurology

## 2019-04-18 ENCOUNTER — Ambulatory Visit: Payer: Medicare HMO

## 2019-05-03 ENCOUNTER — Ambulatory Visit (INDEPENDENT_AMBULATORY_CARE_PROVIDER_SITE_OTHER): Payer: Medicare HMO | Admitting: Family Medicine

## 2019-05-03 ENCOUNTER — Other Ambulatory Visit: Payer: Self-pay

## 2019-05-03 ENCOUNTER — Encounter: Payer: Self-pay | Admitting: Family Medicine

## 2019-05-03 ENCOUNTER — Telehealth: Payer: Self-pay | Admitting: Family Medicine

## 2019-05-03 VITALS — BP 130/68 | HR 87 | Temp 98.5°F | Ht 63.0 in | Wt 122.0 lb

## 2019-05-03 DIAGNOSIS — Z23 Encounter for immunization: Secondary | ICD-10-CM

## 2019-05-03 DIAGNOSIS — Z0000 Encounter for general adult medical examination without abnormal findings: Secondary | ICD-10-CM | POA: Diagnosis not present

## 2019-05-03 LAB — COMPREHENSIVE METABOLIC PANEL
ALT: 8 U/L (ref 0–35)
AST: 9 U/L (ref 0–37)
Albumin: 4.4 g/dL (ref 3.5–5.2)
Alkaline Phosphatase: 35 U/L — ABNORMAL LOW (ref 39–117)
BUN: 10 mg/dL (ref 6–23)
CO2: 28 mEq/L (ref 19–32)
Calcium: 9.6 mg/dL (ref 8.4–10.5)
Chloride: 103 mEq/L (ref 96–112)
Creatinine, Ser: 0.66 mg/dL (ref 0.40–1.20)
GFR: 89.39 mL/min (ref >60.00)
Glucose, Bld: 87 mg/dL (ref 70–99)
Potassium: 4.1 mEq/L (ref 3.5–5.1)
Sodium: 140 mEq/L (ref 135–145)
Total Bilirubin: 0.7 mg/dL (ref 0.2–1.2)
Total Protein: 6.5 g/dL (ref 6.0–8.3)

## 2019-05-03 LAB — LIPID PANEL
Cholesterol: 191 mg/dL (ref 0–200)
HDL: 66 mg/dL (ref >39.00)
LDL Cholesterol: 108 mg/dL — ABNORMAL HIGH (ref 0–99)
NonHDL: 125.1
Total CHOL/HDL Ratio: 3
Triglycerides: 85 mg/dL (ref 0.0–149.0)
VLDL: 17 mg/dL (ref 0.0–40.0)

## 2019-05-03 NOTE — Telephone Encounter (Signed)
lvm for pt to call and schedule follow up appt °

## 2019-05-03 NOTE — Patient Instructions (Addendum)
Give [place] 2-3 business days to get the results of your labs back.   Keep the diet clean and stay active.  Call and schedule your mammogram.   Let us know if you need anything.

## 2019-05-03 NOTE — Progress Notes (Signed)
Chief Complaint  Patient presents with  . Annual Exam     Well Woman Mikayla Ross is here for a complete physical.   Her last physical was >1 year ago.  Current diet: in general, a "healthy" diet. Current exercise: active at home, walking. Weight is stable and she denies daytime fatigue. No LMP recorded. Patient has had a hysterectomy. Seatbelt? Yes  Health Maintenance Colonoscopy- No - can't do colonoscopy at this time Lung cancer screening- N/A; <30 pack yr hx DEXA- Yes Mammogram- Yes - cancelled due to COVID Tetanus- Yes Pneumonia- No; PCV13 1 yr ago, to receive PCV23 today Hep C screen- Yes  Past Medical History:  Diagnosis Date  . Right-sided Bell's palsy 12/27/2017     Past Surgical History:  Procedure Laterality Date  . ABDOMINAL HYSTERECTOMY    . APPENDECTOMY    . TONSILLECTOMY      Medications  Current Outpatient Medications on File Prior to Visit  Medication Sig Dispense Refill  . alendronate (FOSAMAX) 70 MG tablet Take 1 tablet (70 mg total) by mouth every 7 (seven) days. Take with a full glass of water on an empty stomach. 12 tablet 3  . atorvastatin (LIPITOR) 20 MG tablet Take 1 tablet (20 mg total) by mouth daily. 90 tablet 3  . nicotine (NICODERM CQ - DOSED IN MG/24 HOURS) 14 mg/24hr patch Place 1 patch (14 mg total) onto the skin daily. 14 patch 0  . nicotine (NICODERM CQ - DOSED IN MG/24 HOURS) 21 mg/24hr patch Place 1 patch (21 mg total) onto the skin daily. 42 patch 0  . nicotine (NICODERM CQ - DOSED IN MG/24 HR) 7 mg/24hr patch Place 1 patch (7 mg total) onto the skin daily. 14 patch 0   Allergies No Known Allergies  Review of Systems: Constitutional:  no sweats Eye:  no recent significant change in vision Ear/Nose/Mouth/Throat:  Ears:  No changes in hearing Nose/Mouth/Throat:  no complaints of nasal congestion, no sore throat Cardiovascular: no chest pain Respiratory:  No cough and no shortness of breath Gastrointestinal:  no abdominal pain,  no change in bowel habits GU:  Female: negative for dysuria or pelvic pain Musculoskeletal/Extremities:  no pain of the joints Integumentary (Skin/Breast):  no abnormal skin lesions reported Neurologic:  no headaches Psychiatric:  no anxiety, no depression Endocrine:  denies unexplained weight changes Hematologic/Lymphatic:  no abnormal bleeding  Exam BP 130/68 (BP Location: Left Arm, Patient Position: Sitting, Cuff Size: Normal)   Pulse 87   Temp 98.5 F (36.9 C) (Oral)   Ht 5\' 3"  (1.6 m)   Wt 122 lb (55.3 kg)   SpO2 95%   BMI 21.61 kg/m  General:  well developed, well nourished, in no apparent distress Skin:  no significant moles, warts, or growths Head:  no masses, lesions, or tenderness Eyes:  pupils equal and round, sclera anicteric without injection Ears:  canals without lesions, TMs shiny without retraction, no obvious effusion, no erythema Nose:  nares patent, septum midline, mucosa normal, and no drainage or sinus tenderness Throat/Pharynx:  lips and gingiva without lesion; tongue and uvula midline; non-inflamed pharynx; no exudates or postnasal drainage Neck: neck supple without adenopathy, thyromegaly, or masses Lungs:  clear to auscultation, breath sounds equal bilaterally, no respiratory distress Cardio:  regular rate and rhythm, no bruits or LE edema Abdomen:  abdomen soft, nontender; bowel sounds normal; no masses or organomegaly Genital: Deferred Musculoskeletal:  symmetrical muscle groups noted without atrophy or deformity Extremities:  no clubbing, cyanosis, or  edema, no deformities, no skin discoloration Neuro:  gait normal; deep tendon reflexes normal and symmetric Psych: well oriented with normal range of affect and appropriate judgment/insight  Assessment and Plan  Well adult exam - Plan: Comprehensive metabolic panel, Lipid panel   Well 68 y.o. female. Counseled on diet and exercise. Other orders as above. Call and sched mammogram. Cologard. PCV23.   Follow up in 6 mo pending the above workup. The patient voiced understanding and agreement to the plan.  Mikayla Rocheicholas Paul FlowellaWendling, DO 05/03/19 8:33 AM

## 2019-05-03 NOTE — Addendum Note (Signed)
Addended by: Scharlene Gloss B on: 05/03/2019 08:35 AM   Modules accepted: Orders

## 2019-06-05 ENCOUNTER — Ambulatory Visit (INDEPENDENT_AMBULATORY_CARE_PROVIDER_SITE_OTHER): Payer: Medicare HMO | Admitting: Neurology

## 2019-06-05 ENCOUNTER — Encounter: Payer: Self-pay | Admitting: Neurology

## 2019-06-05 ENCOUNTER — Other Ambulatory Visit: Payer: Self-pay

## 2019-06-05 ENCOUNTER — Ambulatory Visit: Payer: Medicare HMO | Admitting: Neurology

## 2019-06-05 VITALS — BP 116/68 | HR 89 | Ht 63.0 in | Wt 119.0 lb

## 2019-06-05 DIAGNOSIS — R413 Other amnesia: Secondary | ICD-10-CM

## 2019-06-05 DIAGNOSIS — Z720 Tobacco use: Secondary | ICD-10-CM | POA: Diagnosis not present

## 2019-06-05 DIAGNOSIS — F17210 Nicotine dependence, cigarettes, uncomplicated: Secondary | ICD-10-CM | POA: Diagnosis not present

## 2019-06-05 DIAGNOSIS — I6523 Occlusion and stenosis of bilateral carotid arteries: Secondary | ICD-10-CM

## 2019-06-05 DIAGNOSIS — F329 Major depressive disorder, single episode, unspecified: Secondary | ICD-10-CM

## 2019-06-05 DIAGNOSIS — R69 Illness, unspecified: Secondary | ICD-10-CM | POA: Diagnosis not present

## 2019-06-05 DIAGNOSIS — R4589 Other symptoms and signs involving emotional state: Secondary | ICD-10-CM

## 2019-06-05 NOTE — Patient Instructions (Addendum)
MRI brain without contrast  We have sent a referral to [facility] for your MRI and they will call you directly to schedule your appointment. They are located at [place]. If you need to contact them directly please call [phone].   Ultrasound carotid arteries  [facility] should contact you to schedule this.  [phone]   You have been referred for a neurocognitive evaluation in our office.   Please follow-up with your primary care doctor for depression  I will call you with the results once they are available

## 2019-06-05 NOTE — Progress Notes (Signed)
[facility] Clinic Note - Initial Visit   Date: 06/05/19  Mikayla Ross MRN: [id number] DOB: 09-May-1951   Dear Dr. Carmelia RollerWendling:   Thank you for your kind referral of Mikayla Ross for consultation of memory impairment. Although her history is well known to you, please allow us to reiterate it for the purpose of our medical record. The patient was accompanied to the clinic by husband who also provides collateral information.     History of Present Illness: Mikayla Ross is a 68 y.o. right-handed female with right Bell's palsy, hyperlipidemia, OSA, and tobacco use  presenting for evaluation of memory loss.   She became the primary care giver to her 67 year-old father around 2018.  Since this time, she and her husband have noticed that she is more forgetful and often misplaces items.  She quickly forgets details of a conversation within minutes and often repeats herself.  She cannot complete tasks. She cooks microwave meals.  She does not use the stove or oven.  She does not manage finances.  She manages her father's medications, but tends to check herself multiple times.  Her mood is "terrible" due to stress related to caring for her 67 year-old father who is blind and bedridden.  She does not have any siblings.    She stopped driving 1 year ago because her husband did not feel she was safe.  She does not have any hobbies.  She enjoys spending time with her husband, but is not able to see him often as she is living with her father.  She endorses feeling depressed.    Out-side paper records, electronic medical record, and images have been reviewed where available and summarized as:  No results found for: HGBA1C Lab Results  Component Value Date   VITAMINB12 408 01/05/2018   Lab Results  Component Value Date   TSH 1.28 01/05/2018   US carotids 01/03/2018: Right Carotid: Velocities in the right ICA are consistent with a 40-59%                stenosis. The ECA  appears >50% stenosed.  Left Carotid: Velocities in the left ICA are consistent with a 1-39% stenosis.  Vertebrals: Right vertebral artery was patent with antegrade flow. Left             vertebral demonstrates abnormal flow. suggestive of distal             stenosis/occlusion   Past Medical History:  Diagnosis Date  . High cholesterol   . Right-sided Bell's palsy 12/27/2017    Past Surgical History:  Procedure Laterality Date  . ABDOMINAL HYSTERECTOMY    . APPENDECTOMY    . TONSILLECTOMY       Medications:  Outpatient Encounter Medications as of 06/05/2019  Medication Sig  . atorvastatin (LIPITOR) 20 MG tablet Take 1 tablet (20 mg total) by mouth daily.  . Multiple Vitamin (MULTIVITAMIN) tablet Take 1 tablet by mouth daily.  . [DISCONTINUED] alendronate (FOSAMAX) 70 MG tablet Take 1 tablet (70 mg total) by mouth every 7 (seven) days. Take with a full glass of water on an empty stomach.  . [DISCONTINUED] nicotine (NICODERM CQ - DOSED IN MG/24 HOURS) 14 mg/24hr patch Place 1 patch (14 mg total) onto the skin daily.  . [DISCONTINUED] nicotine (NICODERM CQ - DOSED IN MG/24 HOURS) 21 mg/24hr patch Place 1 patch (21 mg total) onto the skin daily.  . [DISCONTINUED] nicotine (NICODERM CQ - DOSED IN MG/24 HR)  7 mg/24hr patch Place 1 patch (7 mg total) onto the skin daily.   No facility-administered encounter medications on file as of 06/05/2019.     Allergies: No Known Allergies  Family History: Family History  Problem Relation Age of Onset  . Renal Disease Mother   . Seizures Father   . Renal Disease Father   . Cancer Neg Hx     Social History: Social History   Tobacco Use  . Smoking status: Current Every Day Smoker    Packs/day: 0.50    Years: 50.00    Pack years: 25.00  . Smokeless tobacco: Never Used  Substance Use Topics  . Alcohol use: No    Frequency: Never  . Drug use: No   Social History   Social History Narrative   Lives with husband   Caffeine use:  Coffee daily and diet soda   Right handed     Review of Systems:  CONSTITUTIONAL: No fevers, chills, night sweats, or weight loss.   EYES: No visual changes or eye pain ENT: No hearing changes.  No history of nose bleeds.   RESPIRATORY: No cough, wheezing and shortness of breath.   CARDIOVASCULAR: Negative for chest pain, and palpitations.   GI: Negative for abdominal discomfort, blood in stools or black stools.  No recent change in bowel habits.   GU:  No history of incontinence.   MUSCLOSKELETAL: No history of joint pain or swelling.  No myalgias.   SKIN: Negative for lesions, rash, and itching.   HEMATOLOGY/ONCOLOGY: Negative for prolonged bleeding, bruising easily, and swollen nodes.  No history of cancer.   ENDOCRINE: Negative for cold or heat intolerance, polydipsia or goiter.   PSYCH:  +depression or anxiety symptoms.   NEURO: As Above.   Vital Signs:  BP 116/68   Pulse 89   Ht 5\' 3"  (1.6 m)   Wt 119 lb (54 kg)   SpO2 99%   BMI 21.08 kg/m    General Medical Exam:   General:  Depressed appearing, comfortable.   Eyes/ENT: see cranial nerve examination.   Neck:   No carotid bruits. Respiratory:  Clear to auscultation, good air entry bilaterally.   Cardiac:  Regular rate and rhythm, no murmur.   Extremities:  No deformities, edema, or skin discoloration.  Skin:  No rashes or lesions.  Neurological Exam: MENTAL STATUS including orientation to time, place, person, recent and remote memory, attention span and concentration, language, and fund of knowledge is poor.  Speech is not dysarthric.  Montreal Cognitive Assessment  06/05/2019  Visuospatial/ Executive (0/5) 1  Naming (0/3) 2  Attention: Read list of digits (0/2) 2  Attention: Read list of letters (0/1) 0  Attention: Serial 7 subtraction starting at 100 (0/3) 0  Language: Repeat phrase (0/2) 0  Language : Fluency (0/1) 0  Abstraction (0/2) 0  Delayed Recall (0/5) 0  Orientation (0/6) 2  Total 7  Adjusted Score  (based on education) 8   Serial presidents:  Sunnie Nielsenrump, carter, I don't know Year/Month:  2020, June Recent holiday:  Don't know  CRANIAL NERVES: II:  No visual field defects.  III-IV-VI: Pupils equal round and reactive to light.  Normal conjugate, extra-ocular eye movements in all directions of gaze.  No nystagmus.  No ptosis.   V:  Normal facial sensation.    VII:  Normal facial symmetry and movements.   VIII:  Normal hearing and vestibular function.   IX-X:  Normal palatal movement.   XI:  Normal shoulder  shrug and head rotation.   XII:  Normal tongue strength and range of motion, no deviation or fasciculation.  MOTOR:  Motor strength is 5/5 throughout.  No atrophy, fasciculations or abnormal movements.  No pronator drift.   MSRs:  Right        Left                  brachioradialis 2+  2+  biceps 2+  2+  triceps 2+  2+  patellar 2+  2+  ankle jerk 2+  2+  Hoffman no  no  plantar response down  down   SENSORY:  Normal and symmetric perception of light touch, pinprick, vibration, and proprioception.  Romberg's sign absent.   COORDINATION/GAIT: Normal finger-to- nose-finger and heel-to-shin.  Intact rapid alternating movements bilaterally.  Able to rise from a chair without using arms.  Gait narrow based and stable. Tandem and stressed gait intact.    IMPRESSION: 1. Cognitive impairment.  She scored poorly on her screening MOCA of 8/30 with multidomain deficits.  There is a large overlay of depression which is contributing to her overall state of poor mental health.  I cannot exclude underlying dementia based on her cognitive screen, so she will return for comprehensive neuropsychological testing.  She will also undergo MRI brain wo contrast.   Vitamin B12 and TSH was checked in 2019 and is normal. I have asked her to follow-up with her PCP for management of depression. Recommend that she try to engage in mentally stimulating activities, social engagement, and stop smoking.  2. Left  carotid artery stenosis, 40-59%. Check US carotids for annual surveillance  3.  Tobacco use.   Currently smoking 0.5 packs/day.  She is not interested in quitting.  Patient was informed of the dangers of tobacco abuse including stroke, cancer, and MI, as well as benefits of tobacco cessation.  Approximately 3 mins were spent counseling patient cessation techniques. We discussed various methods to help quit smoking, including deciding on a date to quit, joining a support group, pharmacological agents- nicotine gum/patch/lozenges, chantix.  I will reassess her progress at the next follow-up visit   Further recommendations pending results.   Thank you for allowing me to participate in patient's care.  If I can answer any additional questions, I would be pleased to do so.    Sincerely,    Donika K. Posey Pronto, DO

## 2019-06-09 ENCOUNTER — Other Ambulatory Visit: Payer: Self-pay

## 2019-06-09 ENCOUNTER — Ambulatory Visit (HOSPITAL_COMMUNITY)
Admission: RE | Admit: 2019-06-09 | Discharge: 2019-06-09 | Disposition: A | Discharge status: Home / Self Care | Payer: Medicare HMO | Source: Ambulatory Visit | Attending: Internal Medicine | Admitting: Internal Medicine

## 2019-06-09 DIAGNOSIS — I6523 Occlusion and stenosis of bilateral carotid arteries: Secondary | ICD-10-CM | POA: Insufficient documentation

## 2019-06-09 DIAGNOSIS — F329 Major depressive disorder, single episode, unspecified: Secondary | ICD-10-CM | POA: Diagnosis not present

## 2019-06-09 DIAGNOSIS — R413 Other amnesia: Secondary | ICD-10-CM | POA: Diagnosis not present

## 2019-06-09 DIAGNOSIS — R69 Illness, unspecified: Secondary | ICD-10-CM | POA: Diagnosis not present

## 2019-06-09 DIAGNOSIS — R4589 Other symptoms and signs involving emotional state: Secondary | ICD-10-CM

## 2019-07-11 ENCOUNTER — Other Ambulatory Visit: Payer: Self-pay

## 2019-07-11 ENCOUNTER — Ambulatory Visit
Admission: RE | Admit: 2019-07-11 | Discharge: 2019-07-11 | Disposition: A | Discharge status: Home / Self Care | Payer: Medicare HMO | Source: Ambulatory Visit | Attending: Neurology | Admitting: Neurology

## 2019-07-11 DIAGNOSIS — R413 Other amnesia: Secondary | ICD-10-CM | POA: Diagnosis not present

## 2019-07-11 DIAGNOSIS — R4589 Other symptoms and signs involving emotional state: Secondary | ICD-10-CM

## 2019-07-11 DIAGNOSIS — I6523 Occlusion and stenosis of bilateral carotid arteries: Secondary | ICD-10-CM

## 2019-07-18 ENCOUNTER — Telehealth: Payer: Self-pay | Admitting: Neurology

## 2019-07-18 DIAGNOSIS — R413 Other amnesia: Secondary | ICD-10-CM

## 2019-07-18 DIAGNOSIS — R4589 Other symptoms and signs involving emotional state: Secondary | ICD-10-CM

## 2019-07-18 NOTE — Telephone Encounter (Signed)
MRI results discussed with patient which shows chronic left cerebellar stroke, she is asymptomatic and denies any prior history of stroke.  Recommend that she take aspirin 81mg  daily.  Strongly encouraged tobacco cessation.  She will return to the office for formal neurocognitive testing.

## 2019-07-19 NOTE — Telephone Encounter (Signed)
I will call her to get her sch

## 2019-08-19 DIAGNOSIS — R69 Illness, unspecified: Secondary | ICD-10-CM | POA: Diagnosis not present

## 2019-09-01 ENCOUNTER — Other Ambulatory Visit: Payer: Self-pay

## 2019-09-01 ENCOUNTER — Ambulatory Visit: Payer: Medicare HMO | Admitting: Neurology

## 2019-09-01 ENCOUNTER — Encounter: Payer: Self-pay | Admitting: Neurology

## 2019-09-01 VITALS — BP 161/74 | HR 74 | Temp 98.9°F | Ht 63.0 in | Wt 114.0 lb

## 2019-09-01 DIAGNOSIS — R69 Illness, unspecified: Secondary | ICD-10-CM | POA: Diagnosis not present

## 2019-09-01 DIAGNOSIS — R4589 Other symptoms and signs involving emotional state: Secondary | ICD-10-CM | POA: Diagnosis not present

## 2019-09-01 DIAGNOSIS — I6523 Occlusion and stenosis of bilateral carotid arteries: Secondary | ICD-10-CM

## 2019-09-01 DIAGNOSIS — R413 Other amnesia: Secondary | ICD-10-CM

## 2019-09-01 NOTE — Progress Notes (Signed)
Follow-up Visit   Date: 09/01/19   Mikayla Ross MRN: [id number] DOB: [birth year]   Interim History: Mikayla Ross is a 68 y.o. right-handed female with history of R Bell's palpsy, hyperlipidemia, OSA, and tobacco use returning to the clinic for follow-up of memory loss and bilateral carotid stenosis.  The patient was accompanied to the clinic by husband who also provides collateral information.    History of present illness: She became the primary care giver to her 60 year-old father around 2018.  Since this time, she and her husband have noticed that she is more forgetful and often misplaces items.  She quickly forgets details of a conversation within minutes and often repeats herself.  She cannot complete tasks. She cooks microwave meals.  She does not use the stove or oven.  She does not manage finances.  She manages her father's medications, but tends to check herself multiple times.  Her mood is "terrible" due to stress related to caring for her 77 year-old father who is blind and bedridden.  She does not have any siblings.    She stopped driving 1 year ago because her husband did not feel she was safe.  She does not have any hobbies.  She enjoys spending time with her husband, but is not able to see him often as she is living with her father.  She endorses feeling depressed.   UPDATE 09/01/2019: At her last visit in July, MRI brain was ordered to evaluate for memory changes and showed chronic left cerebellar ischemic changes as well as chronic microvascular ischemic changes.  Patient denies having history of stroke or acute change in equilibrium, vision, or coordination.  She was started on aspirin 81 mg and has been compliant with this.  She denies any new neurological complaints.  She continues to have ongoing issues with being forgetful and difficulty with task completion.  She appears very frustrated with her home situation to have to care for her elderly father.  She continues to  feel depressed and overwhelmed.  Medications:  Current Outpatient Medications on File Prior to Visit  Medication Sig Dispense Refill  . atorvastatin (LIPITOR) 20 MG tablet Take 1 tablet (20 mg total) by mouth daily. 90 tablet 3  . Multiple Vitamin (MULTIVITAMIN) tablet Take 1 tablet by mouth daily.     No current facility-administered medications on file prior to visit.     Allergies: No Known Allergies  Review of Systems:  CONSTITUTIONAL: No fevers, chills, night sweats, or weight loss.  EYES: No visual changes or eye pain ENT: No hearing changes.  No history of nose bleeds.   RESPIRATORY: No cough, wheezing and shortness of breath.   CARDIOVASCULAR: Negative for chest pain, and palpitations.   GI: Negative for abdominal discomfort, blood in stools or black stools.  No recent change in bowel habits.   GU:  No history of incontinence.   MUSCLOSKELETAL: No history of joint pain or swelling.  No myalgias.   SKIN: Negative for lesions, rash, and itching.   ENDOCRINE: Negative for cold or heat intolerance, polydipsia or goiter.   PSYCH:  + depression or anxiety symptoms.   NEURO: As Above.   Vital Signs:  BP (!) 161/74   Pulse 74   Temp 98.9 F (37.2 C)   Ht 5\' 3"  (1.6 m)   Wt 114 lb (51.7 kg)   SpO2 97%   BMI 20.19 kg/m    Neurological Exam: MENTAL STATUS including orientation to time, place -  she does not answer year or month.  President is Trump. She correctly identifies husband and home address.  Attention and concentration is good. Speech is not dysarthric.  CRANIAL NERVES:   Pupils equal round and reactive to light.  Normal conjugate, extra-ocular eye movements in all directions of gaze.  No ptosis   MOTOR:  Motor strength is 5/5 in all extremities.    COORDINATION/GAIT:  Normal finger-to- nose-finger.  Intact rapid alternating movements bilaterally.  Gait narrow based and stable.   Data: MRI brain 07/12/2019: 1.  No acute intracranial abnormality. 2. Chronic  ischemic disease in the left cerebellum. And chronic bilateral cerebral hemisphere small vessel ischemic disease Suspected.  Ultrasound carotid 06/09/2019: Right Carotid: Velocities in the right ICA are consistent with a 40-59% stenosis.                Non-hemodynamically significant plaque <50% noted in the CCA.                The ECA appears >50% stenosed.                The RICA velocities are elevated and stable compared to the prior exam.  Left Carotid: Velocities in the left ICA are consistent with a 40-59% stenosis, based on peak systolic velocities and plaque formation.                 Non-hemodynamically significant plaque noted in the CCA.               The LICA velocities are elevated and stable compared to the prior exam.  Vertebrals:  Right vertebral artery demonstrates antegrade flow. Small caliber left vertebral artery with atypical, antegrade flow. Subclavians: Bilateral subclavian artery flow was disturbed.  *See table(s) above for measurements and observations. Suggest follow up study in 12 months.   Lab Results  Component Value Date   CHOL 191 05/03/2019   HDL 66.00 05/03/2019   LDLCALC 108 (H) 05/03/2019   TRIG 85.0 05/03/2019   CHOLHDL 3 05/03/2019     IMPRESSION/PLAN: 1.  Cognitive impairment most likely secondary to underlying mood disorder.  She is very frustrated with caring for her elderly father and does not have any other options for caregiver support.  My overall suspicion for dementia is very low.  She will undergo neurocognitive testing to better characterize the nature of her symptoms I have encouraged her to talk to her PCP about depression and possibly seeing a counselor  2.  Bilateral carotid stenosis in the legs, moderate (40-59%).  Findings are stable. Recheck carotid ultrasound in 1 year  3.  History of ischemic left cerebellar stroke, age uncertain.  She is asymptomatic.  Imaging was personally reviewed. Continue aspirin 81 mg daily and  statin therapy Strongly urged patient to consider tobacco cessation, however she is not interested in doing this.  Patient educated on stroke risk factors.  Follow-up in the office after her cognitive testing  Greater than 50% of this 25 minute visit was spent in counseling, explanation of diagnosis, planning of further management, and coordination of care.  Thank you for allowing me to participate in patient's care.  If I can answer any additional questions, I would be pleased to do so.    Sincerely,    Tarun Patchell K. Posey Pronto, DO

## 2019-09-01 NOTE — Patient Instructions (Addendum)
Cognitive testing with Dr. Leslee Home have been referred for a neurocognitive evaluation in our office.   The evaluation has two parts.   . The first part of the evaluation is a clinical interview with the neuropsychologist (Dr. Melvyn Novas or Dr. Nicole Kindred). Please bring someone with you to this appointment if possible, as it is helpful for the doctor to hear from both you and another adult who knows you well.   . The second part of the evaluation is testing with the doctor's technician Hinton Dyer or Maudie Mercury). The testing includes a variety of tasks- mostly question-and-answer, some paper-and-pencil. There is nothing you need to do to prepare for this appointment, but having a good night's sleep prior to the testing, taking medications as you normally would, and bringing eyeglasses and hearing aids (if you wear them), is advised. Please make sure that you wear a mask to the appointment.  Please note: We have to reserve several hours of the neuropsychologist's time and the psychometrician's time for your evaluation appointment. As such, please note that there is a No-Show fee of $100. If you are unable to attend any of your appointments, please contact our office as soon as possible to reschedule.

## 2019-10-06 ENCOUNTER — Encounter: Payer: Self-pay | Admitting: Psychology

## 2019-10-06 ENCOUNTER — Ambulatory Visit: Payer: Medicare HMO

## 2019-10-06 ENCOUNTER — Other Ambulatory Visit: Payer: Self-pay

## 2019-10-06 ENCOUNTER — Ambulatory Visit (INDEPENDENT_AMBULATORY_CARE_PROVIDER_SITE_OTHER): Payer: Medicare HMO | Admitting: Psychology

## 2019-10-06 DIAGNOSIS — E785 Hyperlipidemia, unspecified: Secondary | ICD-10-CM

## 2019-10-06 DIAGNOSIS — F0390 Unspecified dementia without behavioral disturbance: Secondary | ICD-10-CM | POA: Insufficient documentation

## 2019-10-06 DIAGNOSIS — I639 Cerebral infarction, unspecified: Secondary | ICD-10-CM | POA: Diagnosis not present

## 2019-10-06 DIAGNOSIS — G3184 Mild cognitive impairment, so stated: Secondary | ICD-10-CM

## 2019-10-06 DIAGNOSIS — G4733 Obstructive sleep apnea (adult) (pediatric): Secondary | ICD-10-CM

## 2019-10-06 HISTORY — DX: Hyperlipidemia, unspecified: E78.5

## 2019-10-06 NOTE — Progress Notes (Signed)
   Neuropsychology Note   Mikayla Ross completed 110 minutes of neuropsychological testing with technician, Cruzita Lederer, B.S., under the supervision of Dr. Christia Reading, Ph.D., licensed neuropsychologist. The patient did not appear overtly distressed by the testing session, per behavioral observation or via self-report to the technician. Rest breaks were offered.    In considering the patient's current level of functioning, level of presumed impairment, nature of symptoms, emotional and behavioral responses during the interview, level of literacy, and observed level of motivation/effort, a battery of tests was selected and communicated to the psychometrician.   Communication between the psychologist and technician was ongoing throughout the testing session and changes were made as deemed necessary based on patient performance on testing, technician observations and additional pertinent factors such as those listed above.   Mikayla Ross will return within approximately two weeks for an interactive feedback session with Dr. Melvyn Novas at which time his test performances, clinical impressions, and treatment recommendations will be reviewed in detail. The patient understands she can contact our office should she require our assistance before this time.   Full report to follow.  110 minutes were spent face-to-face with patient administering standardized tests and 15 minutes were spent scoring (technician). [CPT [id number], [id number]]

## 2019-10-06 NOTE — Progress Notes (Signed)
NEUROPSYCHOLOGICAL EVALUATION Benjamin Perez. [facility]  Reason for Referral:   Mikayla Ross is a 68 y.o. Caucasian female referred by Nita Sickleonika Patel, D.O., to characterize her current cognitive functioning and assist with diagnostic clarity and treatment planning in the context of subjective cognitive decline, remote history of an asymptomatic left cerebellar stroke, and potential psychiatric comorbidities.  Assessment and Plan:   Clinical Impression(s): Mikayla Ross' pattern of performance is suggestive of diffuse cognitive impairment, often in the severe ranges. A relative strength was learning novel visual information. However, despite normative scores suggesting adequate retention of this material, Mikayla Ross was only able to retain 1/5 shapes previously learned. Overall, performance was impaired across domains of processing speed, attention/concentration, executive functioning, receptive and expressive language, visuospatial abilities, and learning and memory. Despite this level of impairment, Mikayla Ross and her husband reported a generally intact ability to perform activities of daily living (ADLs). As such, she meets diagnostic criteria for a Mild Neurocognitive Disorder (formerly "mild cognitive impairment") but is likely towards the severe end of this spectrum based upon current results.  The etiology of Mikayla Ross' deficits is unclear and far more advanced than expected. There is the potential for a vascular component given neuroimaging suggesting a chronic ischemic left cerebellar stroke, as well as chronic microvascular changes and carotid stenosis. However, the location of her stroke and the presence of mild microvascular changes likely do not explain the extent of current levels of impairment. Vascular etiologies are also relatively stable and likely would not exhibit this rate of cognitive decline. There is also potential for a "mixed dementia"  presentation (i.e., vascular and Alzheimer's disease etiologies) given assumed evidence for cognitive decline and largely amnestic memory performance. However, the diffuse nature of her cognitive impairments do not yield discernible patterns for consideration. Current impairments are also above and beyond expected contributions from mood-related factors or untreated obstructive sleep apnea alone. Continued medical monitoring will be important moving forward.   Recommendations: Mikayla Ross and her medical team are strongly encouraged to perform any relevant lab tests to assess for a potentially reversible cause of her cognitive dysfunction (e.g., vitamin deficiencies, presence of a UTI, etc.) as her current level of impairment is somewhat surprising. She is also encouraged to speak to her medical team about sleep apnea treatment. If present and left untreated, it will place her at an increased risk for stroke, heart attack, and further cognitive decline.  A repeat neuropsychological evaluation in 12-18 months (or sooner if functional decline is noted) is recommended to assess the trajectory of future cognitive decline should it occur. This could also aid in future efforts towards improved diagnostic clarity or updated treatment recommendations.  I agree with her husband's decision to stop Mikayla Ross from driving based upon current results. Should she note some improvement in the future, a referral to occupational therapy for a driving evaluation would be prudent before having her resume driving activities.  All important information, especially medical in nature, should be provided to Mikayla Ross in written format in all instances. This will increase the likelihood of recommendation adherence.   Should there be a progression of her current deficits over time, Mikayla Ross is unlikely to regain any independent living skills lost. Therefore, it is recommended that she remain as involved as possible in all aspects of  household chores, finances, and medication management, with supervision to ensure adequate performance. She will likely benefit from the establishment and maintenance of a routine in  order to maximize her functional abilities over time.  It will be important for Mikayla Ross to have another person with her when in situations where she may need to process information, weigh the pros and cons of different options, and make decisions, in order to ensure that she fully understands and recalls all information to be considered.  If not already done, Mikayla Ross and her family may want to discuss her wishes regarding durable power of attorney and medical decision making, so that she can have input into these choices. Additionally, they may wish to discuss future plans for caretaking and seek out community options for in home/residential care should they become necessary.  To address problems with fluctuating attention, she may wish to consider:   -Avoiding external distractions when needing to concentrate   -Limiting exposure to fast paced environments with multiple sensory demands   -Writing down complicated information and using checklists   -Attempting and completing one task at a time (i.e., no multi-tasking)   -Verbalizing aloud each step of a task to maintain focus   -Reducing the amount of information considered at one time  Review of Records:   Mikayla Ross. Baxley was seen by [facility] Nita Sickle, D.O.) on 09/01/2019 for follow-up of memory loss and bilateral carotid stenosis. Mikayla Ross became the primary caregiver to her 37 year old father in 2018. Since that time, her and her husband noted that Mikayla Ross has become more forgetful and often misplaces items. She also quickly forgets details of prior conversations, repeats herself often, and cannot complete tasks. She stopped driving 1 year ago due to her husband not feeling that she was safe behind the wheel; she also does not manage personal finances.  Her mood was described as "terrible" due to stress related to caring for her father. Ultimately, she was referred for a comprehensive neuropsychological evaluation to characterize her cognitive abilities and to assist with diagnostic clarity and treatment planning.   Brain MRI on 07/12/2019 revealed a chronic ischemic left cerebellar stroke, as well as chronic microvascular changes. Carotid ultrasound on 06/09/2019 revealed 40-59% stenosis in both the right and left ICA, said to be stable.   Past Medical History:  Diagnosis Date  . Bilateral carotid artery stenosis 01/05/2018  . Estrogen deficiency 02/14/2018  . High cholesterol   . Hyperlipidemia 10/06/2019  . Obstructive sleep apnea   . Right-sided Bell's palsy 12/27/2017  . Stroke    Asymptomatic ischemic left cerebellar stroke    Past Surgical History:  Procedure Laterality Date  . ABDOMINAL HYSTERECTOMY    . APPENDECTOMY    . TONSILLECTOMY      Family History  Problem Relation Age of Onset  . Renal Disease Mother   . Seizures Father   . Renal Disease Father   . Cancer Neg Hx      Current Outpatient Medications:  .  atorvastatin (LIPITOR) 20 MG tablet, Take 1 tablet (20 mg total) by mouth daily., Disp: 90 tablet, Rfl: 3 .  Multiple Vitamin (MULTIVITAMIN) tablet, Take 1 tablet by mouth daily., Disp: , Rfl:   Clinical Interview:   Cognitive Symptoms: Decreased short-term memory: Endorsed. Provided examples included misplacing objects, repeating herself, and trouble remembering the names of familiar individuals and details of previous conversations.  Decreased long-term memory: Denied. Decreased attention/concentration: Endorsed. Mikayla Ross noted trouble maintaining her focus, increased ease of distractibility, and frequently losing her train of thought while conversing with others.  Reduced processing speed: Denied. However, her husband endorsed that Mikayla Ross. Melvyn Neth  exhibits diminished processing speed.  Difficulties with executive  functions: Endorsed. Difficulties surrounding organization/complex planning and indecisiveness were reported. Trouble with impulsivity or using good judgment was denied.  Difficulties with emotion regulation: Denied. Difficulties with receptive language: Denied. Difficulties with word finding: Endorsed. Decreased visuoperceptual ability: Denied.  Trajectory of deficits: Deficits were said to first become noticeable in 2018 when Mikayla Ross started caring for her 65 year old father who is bedridden and blind; depressive symptoms were also said to start around this time. Since taking over as caregiver, Mikayla Ross. Melvyn Neth' husband reported a noticeable decline in her cognitive abilities over the years.   Difficulties completing ADLs: Denied. Medical records suggest that Mikayla Ross is able to manage her medications, as well as those belonging to her father, well. Her husband manages personal finances. Notably, Mikayla Ross has not driven for the past year as her husband expressed concerns regarding her ability to stay focused and operate a vehicle safely.   Additional Medical History: History of traumatic brain injury/concussion: Denied. History of stroke: Denied. However, medical records and neuroimaging suggest a remote history of an asymptomatic ischemic left cerebellar stroke.  History of seizure activity: Denied. History of known exposure to toxins: Denied. Symptoms of chronic pain: Denied. Experience of frequent headaches/migraines: Denied. Frequent instances of dizziness/vertigo: Denied.  Sensory changes: Denied. Balance/coordination difficulties: Denied. Other motor difficulties: Denied.  Sleep History: Estimated hours obtained each night: 6-8 hours. Difficulties falling asleep: Denied. Difficulties staying asleep: Denied. Feels rested and refreshed upon awakening: Endorsed.  History of snoring: Endorsed. History of waking up gasping for air: Denied. Witnessed breath cessation while asleep:  Endorsed. Her husband reported witnessing Mikayla Ross. Arnesen stop breathing temporarily while asleep across several occasions. Medical records also suggest a history of obstructive sleep apnea. Mikayla Ross. Warrior did not endorse using a CPAP machine to treat this condition.   History of vivid dreaming: Denied. Excessive movement while asleep: Denied outside of occasional tossing and turning behaviors. Instances of acting out her dreams: Denied.  Psychiatric/Behavioral Health History: Depression: Endorsed. As stated above, Mikayla Ross. Chard has served as primary caregiver to her father since 2018. Since that time, she described her mood as "terrible" and acknowledged symptoms of frustration and depression. She denied having any coping mechanisms for dealing with mood concerns outside of "pushing through." She likewise denied current mood-related medication intervention, as well as prior involvement with an individual therapist. Current or remote suicidal ideation, intent, or plan was denied.  Anxiety: Endorsed. Symptoms were said to occur occasionally depending on various life-related stressors. Mania: Denied. Trauma History: Denied. Visual/auditory hallucinations: Denied. Delusional thoughts: Denied.  Tobacco: Endorsed. Mikayla Ross. Lagace estimated consuming between 1/2 and 2/3 pack of cigarettes per day. Alcohol: She denied current alcohol consumption, as well as a history of problematic alcohol use, abuse, or dependence.  Recreational drugs: Denied. Caffeine: 1 large cup of coffee in the morning.  Academic/Vocational History: Highest level of educational attainment: Unclear. During the clinical interview, Mikayla Ross. Morren reported completing high school. However, during testing, she stated that she only completed up to the 10th grade. She described herself as an average B/C student. She initially denied any relative academic weaknesses; however, her husband noted that mathematics represented a weakness while in school.  History of  developmental delay: Denied. History of grade repetition: Denied. However, her husband stated that she had to repeat the 5th grade. Enrollment in special education courses: Denied. History of diagnosed specific learning disability: Denied. History of ADHD: Denied.  Employment: Retired. Mikayla Ross. Stotler was unable  to accurately report her employment history. 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  Evaluation Results:   Behavioral Observations: Mikayla Ross. Henken was accompanied by her husband, arrived to her appointment on time, and was appropriately dressed and groomed. Observed gait and station were within normal limits. Gross motor functioning appeared intact upon informal observation and no abnormal movements (e.g., tremors) were noted. Her affect was generally relaxed and positive, but did range appropriately given the subject being discussed during the clinical interview or the task at hand during testing procedures. Spontaneous speech was fluent and word finding difficulties were not observed during the clinical interview. She was a relatively poor historian, often reporting conflicting information which had to be cleared by her husband. For example, she had to be reminded by her husband that she repeated the 5th grade and had trouble with math while in school. She also exhibited some trouble understanding questions being asked of her and required several repetitions. During testing, Mikayla Ross had notable trouble understanding task instructions and several assessments (TMT B, NAB L&N B, NAB Mazes, and D-KEFS Color-Word Interference) had to be discontinued for this reason. Notably, on NAB Mazes, Mikayla Ross. Roskos at first attempted to place a checkmark in various aspects of the maze or draw lines to close in dead-ends. Upon further instruction, she simply drew a straight line from start to finish without any attempt to follow designated paths. When filling  out mood-related questionnaires, she checked multiple answers within the same question several times, despite assistance being offered. Remaining questionnaires were read aloud to her. Sustained attention was adequate throughout. Task engagement was adequate and she persisted when challenged; however, notable confusion did disrupt testing procedures. Overall, Mikayla Ross. Reali was cooperative with the clinical interview and subsequent testing procedures.   Adequacy of Effort: The validity of neuropsychological testing is limited by the extent to which the individual being tested may be assumed to have exerted adequate effort during testing. Mikayla Ross. Dlugosz expressed her intention to perform to the best of her abilities and exhibited adequate task engagement and persistence. Scores across stand-alone and embedded performance validity measures were variable. However, low performance is believed to be due to symptoms of confusion and cognitive dysfunction, rather than attempts to perform poorly or poor motivation. As such, the results of the current evaluation are believed to be a valid representation of Mikayla Ross. Ellithorpe's current cognitive functioning.  Test Results: Mikayla Ross. Caloca was quite disoriented at the time of the current evaluation. Mikayla Ross. Fuhriman was unable to state her current age ("it's not 46"). She was also unable to state her address (0/2) or phone number (0/3), as well as the current year (0/2), month (0/2), date (0/2), or day of the week (0/2).  Intellectual abilities based upon educational and vocational attainment were estimated to be in the below average to average range. Premorbid abilities were estimated to be within the well below average range based upon a single-word reading test.   Processing speed was impaired. Basic attention was exceptionally low. More complex attention (e.g., working memory) was also exceptionally low. Executive functioning was impaired.  Assessed receptive language abilities were impaired.  Additionally, Mikayla Ross. Askari exhibited significant difficulties understanding task instructions, especially task complexity increased. Assessed expressive language (e.g., verbal fluency and confrontation naming) was impaired.     Assessed visuospatial/visuoconstructional abilities were impaired. For her clock, she drew a circle and included the numbers 10, 11, 12, and 14 in a straight  line down the middle of the clock face; hands were not represented. For her copy of a complex figure, she drew a rectangle, square, and 'X' side by side, as well as the letters "OV" underneath.     Learning (i.e., encoding) of novel verbal information was impaired, while learning of visual information was within normal limits. Spontaneous delayed recall (i.e., retrieval) of previously learned information was impaired overall. Despite normative performance being average across a shape recall task, Mikayla Ross. Marlette was only able to recall 1/5 shapes. Retention rates were very poor across memory measures, with Mikayla Ross. Duffin being amnestic across story and list learning tasks. Performance across recognition tasks was likewise very poor, suggesting minimal evidence for information consolidation.   Results of emotional screening instruments suggested that recent symptoms of generalized anxiety were in the mild to moderate range, while symptoms of depression were within the mild range. A screening instrument assessing recent sleep quality suggested the presence of minimal sleep dysfunction.  Tables of Scores:   Note: This summary of test scores accompanies the interpretive report and should not be considered in isolation without reference to the appropriate sections in the text. Descriptors are based on appropriate normative data and may be adjusted based on clinical judgment. The terms "impaired" and "within normal limits (WNL)" are used when a more specific level of functioning cannot be determined.       Effort Testing:   DESCRIPTOR        Dot Counting Test: --- --- Below Expectation  CVLT-III Forced Choice Recognition: --- --- Within Expectation       Cognitive Screening:          NAB Screening Battery, Form 2 Standard Score/ T Score Percentile   Total Score 50 <1 Exceptionally Low    Orientation 14/29 --- ---  Attention Domain 52 <1 Exceptionally Low    Digits Forward 37 9 Below Average    Digits Backwards 29 2 Exceptionally Low    Letters & Numbers A Efficiency 19 <1 Exceptionally Low    Letters & Numbers B Efficiency 19 <1 Exceptionally Low  Language Domain 45 <1 Exceptionally Low    Auditory Comprehension 19 <1 Exceptionally Low    Naming 19 <1 Exceptionally Low  Memory Domain 82 12 Below Average    Shape Learning Immediate Recognition 96 97 Well Above Average    Story Learning Immediate Recall 30 2 Well Below Average    Shape Learning Delayed Recognition 45 31 Average    Story Learning Delayed Recall 24 <1 Exceptionally Low  Spatial Domain 56 <1 Exceptionally Low    Visual Discrimination 19 <1 Exceptionally Low    Design Construction 30 2 Well Below Average  Executive Functions Domain 59 <1 Exceptionally Low    Mazes 28 2 Exceptionally Low    Word Generation 26 1 Exceptionally Low       Intellectual Functioning:           Standard Score Percentile   Test of Premorbid Functioning: 25 6 Well Below Average       Memory:          [place] Verbal Learning Test (CVLT-III) Brief Form: Raw Score (Scaled/Standard Score) Percentile     Total Trials 1-4 1/36 (45) <1 Exceptionally Low    Short-Delay Free Recall 0/9 (1) <1 Exceptionally Low    Long-Delay Free Recall 0/9 (1) <1 Exceptionally Low    Long-Delay Cued Recall 0/9 (1) <1 Exceptionally Low      Recognition Hits 8/9 (9) 37  Average      False Positive Errors 13 (1) <1 Exceptionally Low       Attention/Executive Function:          Trail Making Test (TMT): Raw Score (T Score) Percentile     Part A Discontinued @ 300, 6 errors <1 Exceptionally Low     Part B Discontinued --- Impaired        Scaled Score Percentile   WAIS-IV Coding: 1 <1 Exceptionally Low       D-KEFS Color-Word Interference Test: Raw Score (Scaled Score) Percentile     Color Naming 90 secs. (1) <1 Exceptionally Low    Word Reading 90 secs. (1) <1 Exceptionally Low    Inhibition Discontinued --- Impaired    Inhibition/Switching Discontinued --- Impaired       D-KEFS Verbal Fluency Test: Raw Score (Scaled Score) Percentile     Letter Total Correct 6 (1) <1 Exceptionally Low    Category Total Correct 10 (1) <1 Exceptionally Low    Category Switching Total Correct 3 (1) <1 Exceptionally Low    Category Switching Accuracy 0 (1) <1 Exceptionally Low      Total Set Loss Errors 4 (8) 25 Average      Total Repetition Errors 1 (12) 75 Above Average       Language:          Verbal Fluency Test: Raw Score (T Score) Percentile     Phonemic Fluency (FAS) 6 (19) <1 Exceptionally Low    Animal Fluency 4 (11) <1 Exceptionally Low        NAB Language Module, Form 2: T Score Percentile     Naming 16/31 (19) <1 Exceptionally Low       Visuospatial/Visuoconstruction:      Raw Score Percentile   Clock Drawing: 2/10 --- Impaired       NAB Spatial Module, Form 2: T Score Percentile     Figure Drawing Copy 21 <1 Exceptionally Low        Scaled Score Percentile   WAIS-IV Matrix Reasoning: 5 5 Well Below Average       Mood and Personality:      Raw Score Percentile   Geriatric Depression Scale: 14 --- Mild  Geriatric Anxiety Scale: 13 --- Mild    Somatic 3 --- Minimal    Cognitive 8 --- Moderate    Affective 2 --- Minimal       Additional Questionnaires:      Raw Score Percentile   PROMIS Sleep Disturbance Questionnaire: 8 --- None to Slight   Informed Consent and Coding/Compliance:   Mikayla Ross. Kiesel was provided with a verbal description of the nature and purpose of the present neuropsychological evaluation. Also reviewed were the foreseeable risks and/or discomforts and  benefits of the procedure, limits of confidentiality, and mandatory reporting requirements of this provider. The patient was given the opportunity to ask questions and receive answers about the evaluation. Oral consent to participate was provided by the patient.   This evaluation was conducted by Newman Nickels, Ph.D., licensed clinical neuropsychologist. Mikayla Ross. [place] completed a 30-minute clinical interview, billed as one unit [phone], and 125 minutes of cognitive testing, billed as one unit [phone] and three additional units [phone]. Psychometrist Shan Levans, B.S., assisted Dr. Milbert Coulter with test administration and scoring procedures. As a separate and discrete service, Dr. Milbert Coulter spent a total of 180 minutes in interpretation and report writing, billed as one unit [id number] and two units [id number].

## 2019-11-09 ENCOUNTER — Ambulatory Visit (INDEPENDENT_AMBULATORY_CARE_PROVIDER_SITE_OTHER): Payer: Medicare HMO | Admitting: Psychology

## 2019-11-09 ENCOUNTER — Encounter: Payer: Self-pay | Admitting: Psychology

## 2019-11-09 ENCOUNTER — Other Ambulatory Visit: Payer: Self-pay

## 2019-11-09 DIAGNOSIS — G3184 Mild cognitive impairment, so stated: Secondary | ICD-10-CM

## 2019-11-09 NOTE — Progress Notes (Signed)
   Neuropsychology Feedback Session Mikayla Ross. [place] [facility]  Reason for Referral:   Mikayla Ross a 68 y.o. Caucasian female referred by Narda Amber, D.O.,to characterize hercurrent cognitive functioning and assist with diagnostic clarity and treatment planning in the context of subjective cognitive decline, remote history of an asymptomatic left cerebellar stroke, and potential psychiatric comorbidities.  Feedback:   Mikayla Ross completed a comprehensive neuropsychological evaluation on 10/06/2019. Please refer to that encounter for the full report and recommendations. Briefly, results suggested diffuse cognitive impairment, often in the severe ranges. Despite this level of impairment, Mikayla Ross and her husband reported a generally intact ability to perform activities of daily living (ADLs). As such, she meets diagnostic criteria for a mild neurocognitive disorder (formerly "mild cognitive impairment") but is likely towards the severe end of this spectrum based upon current results. The etiology of [name]' deficits is unclear and far more advanced than expected. There is the potential for a vascular component given neuroimaging suggesting a chronic ischemic left cerebellar stroke, as well as chronic microvascular changes and carotid stenosis. However, the location of her stroke and the presence of mild microvascular changes likely do not explain the extent of current levels of impairment. Vascular etiologies are also relatively stable and likely would not exhibit this rate of cognitive decline. There is also potential for a "mixed dementia" presentation (i.e., vascular and Alzheimer's disease etiologies) given assumed evidence for cognitive decline and largely amnestic memory performance. However, the diffuse nature of her cognitive impairments do not yield discernible patterns for consideration.  Mikayla Ross was accompanied by her husband during the current  telephone call. Content of the current session focused on the results of the current evaluation. Mikayla Ross and her husband were given the opportunity to ask questions and their questions were answered. They were encouraged to reach out should additional questions arise.     A total of 15 minutes were spent with Mikayla Ross during the current feedback session.

## 2019-11-14 ENCOUNTER — Other Ambulatory Visit: Payer: Self-pay

## 2019-11-14 ENCOUNTER — Encounter: Payer: Self-pay | Admitting: Family Medicine

## 2019-11-14 ENCOUNTER — Ambulatory Visit (INDEPENDENT_AMBULATORY_CARE_PROVIDER_SITE_OTHER): Payer: Medicare HMO | Admitting: Family Medicine

## 2019-11-14 VITALS — BP 140/82 | HR 86 | Temp 96.7°F | Ht 63.0 in | Wt 110.0 lb

## 2019-11-14 DIAGNOSIS — F329 Major depressive disorder, single episode, unspecified: Secondary | ICD-10-CM

## 2019-11-14 DIAGNOSIS — G3184 Mild cognitive impairment, so stated: Secondary | ICD-10-CM

## 2019-11-14 DIAGNOSIS — R69 Illness, unspecified: Secondary | ICD-10-CM | POA: Diagnosis not present

## 2019-11-14 DIAGNOSIS — F32A Depression, unspecified: Secondary | ICD-10-CM

## 2019-11-14 DIAGNOSIS — F419 Anxiety disorder, unspecified: Secondary | ICD-10-CM

## 2019-11-14 MED ORDER — DONEPEZIL HCL 5 MG PO TABS: 5.0000 mg | ORAL_TABLET | Freq: Every day | ORAL | 1 refills | Status: DC

## 2019-11-14 MED ORDER — SERTRALINE HCL 50 MG PO TABS: 50.0000 mg | ORAL_TABLET | Freq: Every day | ORAL | 3 refills | Status: DC

## 2019-11-14 NOTE — Patient Instructions (Addendum)
Get back into reading. Consider puzzles and other brain stimulating activities.   Consider getting back into music.   Keep a close eye on your mood over the next month.   Please consider counseling. Contact [phone] to schedule an appointment or inquire about cost/insurance coverage.  Stop smoking.   Coping skills Choose 5 that work for you:  Take a deep breath  Count to 20  Read a book  Do a puzzle  Meditate  Bake  Sing  Knit  Garden  Pray  Go outside  Call a friend  Listen to music  Take a walk  Color  Send a note  Take a bath  Watch a movie  Be alone in a quiet place  Pet an animal  Visit a friend  Journal  Exercise  Stretch   Let us know if you need anything.

## 2019-11-14 NOTE — Progress Notes (Signed)
Chief Complaint  Patient presents with  . Memory Loss    Subjective: Patient is a 68 y.o. female here for f/u memory loss. Here w husband who helps provide hx.  Pt f/u for memory loss. Saw neuropsych and dx'd w mild neurocog deficit. Spouse thinks things are getting worse. She will forget what she is talking about or lose track of things mid-thought/sentence. Would like opinion on medication. Has never been on anything. She is continuing to smoke. This issue is though to be a combination of vascular dementia from smoking and prior CVA and possibly Alzheimer's.  Pt w hx of moodiness. Not taking any medication. Not following with counselor or psychologist.   ROS: Neuro: +memory loss Psych: +agitation  Past Medical History:  Diagnosis Date  . Bilateral carotid artery stenosis 01/05/2018  . Estrogen deficiency 02/14/2018  . High cholesterol   . Hyperlipidemia 10/06/2019  . Mild neurocognitive disorder 10/06/2019  . Obstructive sleep apnea   . Right-sided Bell's palsy 12/27/2017  . Stroke    Asymptomatic ischemic left cerebellar stroke    Objective: BP 140/82 (BP Location: Left Arm, Patient Position: Sitting, Cuff Size: Normal)   Pulse 86   Temp (!) 96.7 F (35.9 C) (Temporal)   Ht 5\' 3"  (1.6 m)   Wt 110 lb (49.9 kg)   SpO2 96%   BMI 19.49 kg/m  General: Awake, appears stated age Lungs: No accessory muscle use Psych:normal affect and mood  Assessment and Plan: Mild neurocognitive disorder  Anxiety and depression  1- Start Aricept. Try to do mentally stimulating things at home. Stop smoking. 2- Start SSRI, 25 mg/d for 2 weeks then 50 mg/d. Counseling info provided.  F/u in 1 mo to reck.  The patient and her husband voiced understanding and agreement to the plan.  [place], DO 11/14/19  11:54 AM

## 2019-12-12 ENCOUNTER — Other Ambulatory Visit: Payer: Self-pay

## 2019-12-12 ENCOUNTER — Ambulatory Visit: Payer: Medicare HMO | Admitting: Family Medicine

## 2019-12-12 ENCOUNTER — Encounter: Payer: Self-pay | Admitting: Family Medicine

## 2019-12-12 ENCOUNTER — Ambulatory Visit (INDEPENDENT_AMBULATORY_CARE_PROVIDER_SITE_OTHER): Payer: Medicare HMO | Admitting: Family Medicine

## 2019-12-12 VITALS — BP 132/82 | HR 86 | Temp 97.5°F | Ht 63.0 in | Wt 110.0 lb

## 2019-12-12 DIAGNOSIS — F32A Depression, unspecified: Secondary | ICD-10-CM

## 2019-12-12 DIAGNOSIS — F329 Major depressive disorder, single episode, unspecified: Secondary | ICD-10-CM

## 2019-12-12 DIAGNOSIS — G3184 Mild cognitive impairment, so stated: Secondary | ICD-10-CM | POA: Diagnosis not present

## 2019-12-12 DIAGNOSIS — R69 Illness, unspecified: Secondary | ICD-10-CM | POA: Diagnosis not present

## 2019-12-12 DIAGNOSIS — F419 Anxiety disorder, unspecified: Secondary | ICD-10-CM

## 2019-12-12 MED ORDER — DONEPEZIL HCL 10 MG PO TABS: 10.0000 mg | ORAL_TABLET | Freq: Every day | ORAL | 2 refills | Status: DC

## 2019-12-12 NOTE — Patient Instructions (Signed)
Please consider counseling. Contact [phone] to schedule an appointment or inquire about cost/insurance coverage.  Send message in 2 weeks with progress for stress.  Stay active.  Take 2 tabs of the Aricept 5 mg for a total of 10 mg daily until you run out. A new dose will be called in.  Continue on the current dose of the Zoloft.  Let us know if you need anything.

## 2019-12-12 NOTE — Progress Notes (Signed)
Chief Complaint  Patient presents with  . Follow-up    Subjective: Patient is a 69 y.o. female here for f/u. Here w husband who helps provide hx.   Started on Aricept for MCI. No AE's, takes 5 mg qhs. Remembers most of the time. Dorene Sorrow initially states he did not notice a difference, but then follows up with he has noticed her memory continuing to slowly fade. She does have a lot of stress as her blind father is being cared for by her. She was encouraged to start counseling and also started on Zoloft. She is finishing up week 3 of initiation of this med. No AE's, usually remembers to take. No difference on stress levels at this point.   ROS: Neuro: As noted in HPI Psych: +stress  Past Medical History:  Diagnosis Date  . Bilateral carotid artery stenosis 01/05/2018  . Estrogen deficiency 02/14/2018  . High cholesterol   . Hyperlipidemia 10/06/2019  . Mild neurocognitive disorder 10/06/2019  . Obstructive sleep apnea   . Right-sided Bell's palsy 12/27/2017  . Stroke    Asymptomatic ischemic left cerebellar stroke    Objective: BP 132/82 (BP Location: Left Arm, Patient Position: Sitting, Cuff Size: Normal)   Pulse 86   Temp (!) 97.5 F (36.4 C) (Temporal)   Ht 5\' 3"  (1.6 m)   Wt 110 lb (49.9 kg)   SpO2 97%   BMI 19.49 kg/m  General: Awake, appears stated age Heart: RRR, no LE edema Lungs: CTAB, no rales, wheezes or rhonchi. No accessory muscle use Psych: limited judgment and insight, normal affect and mood  Assessment and Plan: Mild neurocognitive disorder - Plan: donepezil (ARICEPT) 10 MG tablet  Anxiety and depression - Plan: sertraline (ZOLOFT) 50 MG tablet  1- Increase Aricept to 10 mg qhs. Contact counseling. 2- As above Cont Zoloft. Might be too soon to call it a failure. Reinforced considering counseling, info again given.  F/u in 6-7 weeks.  The patient and her husband voiced understanding and agreement to the plan.  [place], DO 12/12/19  2:10  PM

## 2020-01-29 ENCOUNTER — Other Ambulatory Visit: Payer: Self-pay

## 2020-01-30 ENCOUNTER — Other Ambulatory Visit: Payer: Self-pay

## 2020-01-30 ENCOUNTER — Encounter: Payer: Self-pay | Admitting: Family Medicine

## 2020-01-30 ENCOUNTER — Ambulatory Visit (INDEPENDENT_AMBULATORY_CARE_PROVIDER_SITE_OTHER): Payer: Medicare HMO | Admitting: Family Medicine

## 2020-01-30 VITALS — BP 192/98 | HR 83 | Temp 97.6°F | Ht 63.0 in | Wt 115.1 lb

## 2020-01-30 DIAGNOSIS — F32A Depression, unspecified: Secondary | ICD-10-CM

## 2020-01-30 DIAGNOSIS — F329 Major depressive disorder, single episode, unspecified: Secondary | ICD-10-CM

## 2020-01-30 DIAGNOSIS — G3184 Mild cognitive impairment, so stated: Secondary | ICD-10-CM

## 2020-01-30 DIAGNOSIS — R69 Illness, unspecified: Secondary | ICD-10-CM | POA: Diagnosis not present

## 2020-01-30 DIAGNOSIS — F419 Anxiety disorder, unspecified: Secondary | ICD-10-CM | POA: Diagnosis not present

## 2020-01-30 NOTE — Progress Notes (Signed)
CC: Follow-up  Subjective: Patient is a 69 y.o. female here for f/u.  She is here with her husband who helps provide the history.  The patient has been taking sertraline 50 mg daily for the past 2 months.  Unfortunately, she will forget to take this in has actually been taking it one every 2 days.  She is not have any side effects.  Reports about 20-30% improvement overall.  Taking care of her sick father adds the most to her stress.  She is not following with a counselor or psychologist.  Her memory is not any better either.  She is compliant with the Aricept.  No side effects with any of her medications.  She does seem to remember to take the Aricept.   ROS: Psych: +anxiety Neuro: As noted in HPI  Past Medical History:  Diagnosis Date  . Bilateral carotid artery stenosis 01/05/2018  . Estrogen deficiency 02/14/2018  . High cholesterol   . Hyperlipidemia 10/06/2019  . Mild neurocognitive disorder 10/06/2019  . Obstructive sleep apnea   . Right-sided Bell's palsy 12/27/2017  . Stroke    Asymptomatic ischemic left cerebellar stroke    Objective: BP (!) 192/98 (BP Location: Left Arm, Patient Position: Sitting, Cuff Size: Normal)   Pulse 83   Temp 97.6 F (36.4 C) (Temporal)   Ht 5\' 3"  (1.6 m)   Wt 115 lb 2 oz (52.2 kg)   SpO2 97%   BMI 20.39 kg/m  General: Awake, appears stated age Lungs:No accessory muscle use Psych: normal affect and mood  Assessment and Plan: Anxiety and depression  Mild neurocognitive disorder  1-continue Zoloft to 50 mg daily.  Okay to start taking it at night so she will remember to take it.  In the next 4 weeks, if she is not improving, they will reach out and let me know.  I will change the dosing to 100 mg daily at that time. 2-continue Aricept.  If we optimize #1 and are still having issues, I will plan to refer to the neurology team for further evaluation/management.  Stay active.  Try to read and do puzzles to stimulate her mind. Monitor blood  pressure, does report being stressed. I will see her in 6 months unless she needs me. The patient and her husband voiced understanding and agreement to the plan.  [place], DO 01/30/20  12:10 PM

## 2020-01-30 NOTE — Patient Instructions (Addendum)
Take the Zoloft (sertraline) at night to help you remember to take it. In the next 4 weeks, if it is not particularly helpful, send me a message or call. If it is helpful, no need to me know.   Stay active. Keep moving.  Try to read and do puzzles.  Let us know if you need anything.

## 2020-05-08 ENCOUNTER — Other Ambulatory Visit: Payer: Self-pay

## 2020-05-08 ENCOUNTER — Encounter: Payer: Self-pay | Admitting: Family Medicine

## 2020-05-08 ENCOUNTER — Ambulatory Visit (INDEPENDENT_AMBULATORY_CARE_PROVIDER_SITE_OTHER): Payer: Medicare HMO | Admitting: Family Medicine

## 2020-05-08 VITALS — BP 142/86 | HR 53 | Temp 95.9°F | Ht 63.0 in | Wt 114.4 lb

## 2020-05-08 DIAGNOSIS — G3184 Mild cognitive impairment, so stated: Secondary | ICD-10-CM | POA: Diagnosis not present

## 2020-05-08 DIAGNOSIS — R413 Other amnesia: Secondary | ICD-10-CM | POA: Diagnosis not present

## 2020-05-08 MED ORDER — MEMANTINE HCL 5 MG PO TABS: 5.0000 mg | ORAL_TABLET | Freq: Two times a day (BID) | ORAL | 3 refills | Status: DC

## 2020-05-08 NOTE — Patient Instructions (Addendum)
I commend you for your smoking cessation habits.  Reach out to your neurologist regarding your memory. If you cannot get in within 2 months, schedule a 1 month follow up with me.  Aim to do some physical exertion for 150 minutes per week. This is typically divided into 5 days per week, 30 minutes per day. The activity should be enough to get your heart rate up. Anything is better than nothing if you have time constraints.  Keep the diet clean.  I want you reading, doing puzzles and stimulating your mind.   Let us know if you need anything.

## 2020-05-08 NOTE — Progress Notes (Signed)
Chief Complaint  Patient presents with  . Follow-up    Subjective: Patient is a 69 y.o. female here for f/u.  Here with her husband, Dorene Sorrow.  Her last visit was 3 months ago.  Cognition has declined further.  She is compliant with Zoloft and Aricept over the past 3 weeks.  Prior to this, she is not compliant with medications and was reportedly drinking.  Is unclear how much.  She continues to smoke.  She is reading daily.  She does not exercise.  Diet is fair.  She does see a neurologist but has not seen her since worsening cognition.  Her behavior has been within acceptable parameters and she is not acting out or becoming violent.  She does have a history of CVA.  Past Medical History:  Diagnosis Date  . Bilateral carotid artery stenosis 01/05/2018  . Estrogen deficiency 02/14/2018  . High cholesterol   . Hyperlipidemia 10/06/2019  . Mild neurocognitive disorder 10/06/2019  . Obstructive sleep apnea   . Right-sided Bell's palsy 12/27/2017  . Stroke    Asymptomatic ischemic left cerebellar stroke    Objective: BP (!) 142/86 (BP Location: Left Arm, Patient Position: Sitting, Cuff Size: Normal)   Pulse (!) 53   Temp (!) 95.9 F (35.5 C) (Temporal)   Ht 5\' 3"  (1.6 m)   Wt 114 lb 6 oz (51.9 kg)   SpO2 98%   BMI 20.26 kg/m  General: Awake, appears stated age Lungs: No accessory muscle use Psych: Limited judgment and insight, normal affect and mood  Assessment and Plan: Mild neurocognitive disorder - Plan: memantine (NAMENDA) 5 MG tablet  Memory changes - Plan: memantine (NAMENDA) 5 MG tablet  Continue Zoloft and Aricept.  Add Namenda.  Counseled on diet and exercise.  Continue reading and doing puzzles/mental exercises.  Stop smoking.  Cannot have alcohol. The patient and her husband voiced understanding and agreement to the plan.  [place], DO 05/08/20  11:52 AM

## 2020-05-25 ENCOUNTER — Other Ambulatory Visit: Payer: Self-pay | Admitting: Family Medicine

## 2020-05-25 DIAGNOSIS — F32A Depression, unspecified: Secondary | ICD-10-CM

## 2020-05-25 DIAGNOSIS — G3184 Mild cognitive impairment, so stated: Secondary | ICD-10-CM

## 2020-05-25 DIAGNOSIS — F419 Anxiety disorder, unspecified: Secondary | ICD-10-CM

## 2020-05-27 ENCOUNTER — Ambulatory Visit (INDEPENDENT_AMBULATORY_CARE_PROVIDER_SITE_OTHER): Payer: Medicare HMO | Admitting: Family Medicine

## 2020-05-27 ENCOUNTER — Other Ambulatory Visit: Payer: Self-pay

## 2020-05-27 ENCOUNTER — Encounter: Payer: Self-pay | Admitting: Family Medicine

## 2020-05-27 VITALS — BP 128/72 | HR 62 | Temp 96.4°F | Ht 63.0 in | Wt 118.2 lb

## 2020-05-27 DIAGNOSIS — G3184 Mild cognitive impairment, so stated: Secondary | ICD-10-CM

## 2020-05-27 MED ORDER — MEMANTINE HCL 10 MG PO TABS: 10.0000 mg | ORAL_TABLET | Freq: Two times a day (BID) | ORAL | 3 refills | Status: DC

## 2020-05-27 MED ORDER — DONEPEZIL HCL 10 MG PO TABS: 10.0000 mg | ORAL_TABLET | Freq: Every day | ORAL | 2 refills | Status: DC

## 2020-05-27 NOTE — Patient Instructions (Signed)
Let's stop the Zoloft.  I would call Dr. Eliane Decree office every couple days to see if there is an opening with her or with one of her associates in the same practice.   Please consider counseling. Contact [phone] to schedule an appointment or inquire about cost/insurance coverage.  Aim to do some physical exertion for 150 minutes per week. This is typically divided into 5 days per week, 30 minutes per day. The activity should be enough to get your heart rate up. Anything is better than nothing if you have time constraints.  Strong work with quitting the cigarettes.  Let us know if you need anything.

## 2020-05-27 NOTE — Progress Notes (Signed)
Chief Complaint  Patient presents with  . Follow-up    Subjective: Patient is a 69 y.o. female here for f/u. Here w spouse Dorene Sorrow.   Recently started on Namenda and taking the Zoloft daily for past month. No worsening on Namenda, no AE's. Compliant. Notices 5-10% improvement on Zoloft, but her father passed away so is unsure if the closure from his death brought the improvement or that. She stopped smoking 1 week ago. She has not drank alcohol in the past 3 months. She is reading and doing puzzles to challenge herself mentally.   Past Medical History:  Diagnosis Date  . Bilateral carotid artery stenosis 01/05/2018  . Estrogen deficiency 02/14/2018  . High cholesterol   . Hyperlipidemia 10/06/2019  . Mild neurocognitive disorder 10/06/2019  . Obstructive sleep apnea   . Right-sided Bell's palsy 12/27/2017  . Stroke    Asymptomatic ischemic left cerebellar stroke    Objective: BP 128/72 (BP Location: Left Arm, Patient Position: Sitting, Cuff Size: Normal)   Pulse 62   Temp (!) 96.4 F (35.8 C) (Temporal)   Ht 5\' 3"  (1.6 m)   Wt 118 lb 4 oz (53.6 kg)   SpO2 99%   BMI 20.95 kg/m  General: Awake, appears stated age Neuro: Alert to place and person; not date or president Heart: RRR, no LE edema Lungs: CTAB, no rales, wheezes or rhonchi. No accessory muscle use Psych: limited judgment and insight, normal affect and mood  Assessment and Plan: Mild neurocognitive disorder - Plan: donepezil (ARICEPT) 10 MG tablet, memantine (NAMENDA) 10 MG tablet  Increase dose of Namenda from 5 mg bid to 10 mg bid. Cont mentally challenging activities like reading. Exercise reinforced today. [facility] info given. Stop Zoloft. Neuro visit in Sept. F/u in 1 mo. Could consider Wellbutrin or Selegiline (though no data for).  The patient and spouse voiced understanding and agreement to the plan.  09-18-1976 [name], DO 05/27/20  10:45 AM

## 2020-05-27 NOTE — Progress Notes (Deleted)
I connected with Mikayla Ross today by telephone and verified that I am speaking with the correct person using two identifiers. Location patient: home Location provider: work Persons participating in the virtual visit: patient, Engineer, civil (consulting).    I discussed the limitations, risks, security and privacy concerns of performing an evaluation and management service by telephone and the availability of in person appointments. I also discussed with the patient that there may be a patient responsible charge related to this service. The patient expressed understanding and verbally consented to this telephonic visit.    Interactive audio and video telecommunications were attempted between this provider and patient, however failed, due to patient having technical difficulties OR patient did not have access to video capability.  We continued and completed visit with audio only.  Some vital signs may be absent or patient reported.    Subjective:   Mikayla Ross is a 69 y.o. female who presents for an Initial Medicare Annual Wellness Visit.  Review of Systems           Objective:    There were no vitals filed for this visit. There is no height or weight on file to calculate BMI.  Advanced Directives 09/01/2019 06/05/2019  Does Patient Have a Medical Advance Directive? No No    Current Medications (verified) Outpatient Encounter Medications as of 05/28/2020  Medication Sig  . atorvastatin (LIPITOR) 20 MG tablet Take 1 tablet (20 mg total) by mouth daily.  Marland Kitchen donepezil (ARICEPT) 10 MG tablet Take 1 tablet (10 mg total) by mouth at bedtime.  . memantine (NAMENDA) 10 MG tablet Take 1 tablet (10 mg total) by mouth 2 (two) times daily.  . Multiple Vitamin (MULTIVITAMIN) tablet Take 1 tablet by mouth daily.   No facility-administered encounter medications on file as of 05/28/2020.    Allergies (verified) Patient has no known allergies.   History: Past Medical History:  Diagnosis Date  . Bilateral carotid artery  stenosis 01/05/2018  . Estrogen deficiency 02/14/2018  . High cholesterol   . Hyperlipidemia 10/06/2019  . Mild neurocognitive disorder 10/06/2019  . Obstructive sleep apnea   . Right-sided Bell's palsy 12/27/2017  . Stroke    Asymptomatic ischemic left cerebellar stroke   Past Surgical History:  Procedure Laterality Date  . ABDOMINAL HYSTERECTOMY    . APPENDECTOMY    . TONSILLECTOMY     Family History  Problem Relation Age of Onset  . Renal Disease Mother   . Seizures Father   . Renal Disease Father   . Cancer Neg Hx    Social History   Socioeconomic History  . Marital status: Married    Spouse name: Not on file  . Number of children: 2  . Years of education: 59  . Highest education level: High school graduate  Occupational History  . Occupation: Retired  Tobacco Use  . Smoking status: Current Every Day Smoker    Packs/day: 0.50    Years: 50.00    Pack years: 25.00  . Smokeless tobacco: Never Used  Vaping Use  . Vaping Use: Never used  Substance and Sexual Activity  . Alcohol use: No  . Drug use: No  . Sexual activity: Not on file  Other Topics Concern  . Not on file  Social History Narrative   Lives with husband   Caffeine use: Coffee daily and diet soda   Right handed    Social Determinants of Health   Financial Resource Strain:   . Difficulty of Paying Living Expenses:  Food Insecurity:   . Worried About Charity fundraiser in the Last Year:   . Arboriculturist in the Last Year:   Transportation Needs:   . Film/video editor (Medical):   Marland Kitchen Lack of Transportation (Non-Medical):   Physical Activity:   . Days of Exercise per Week:   . Minutes of Exercise per Session:   Stress:   . Feeling of Stress :   Social Connections:   . Frequency of Communication with Friends and Family:   . Frequency of Social Gatherings with Friends and Family:   . Attends Religious Services:   . Active Member of Clubs or Organizations:   . Attends Archivist  Meetings:   Marland Kitchen Marital Status:     Tobacco Counseling Ready to quit: Not Answered Counseling given: Not Answered   Clinical Intake:                         Activities of Daily Living No flowsheet data found.  Patient Care Team: Shelda Pal, DO as PCP - General (Family Medicine) Alda Berthold, DO as Consulting Physician (Neurology)  Indicate any recent Medical Services you may have received from other than [facility] providers in the past year (date may be approximate).     Assessment:   This is a routine wellness examination for Mikayla Ross.  Hearing/Vision screen No exam data present  Dietary issues and exercise activities discussed:   Diet (meal preparation, eat out, water intake, caffeinated beverages, dairy products, fruits and vegetables): {Desc; diets:16563} Breakfast: Lunch:  Dinner:      Goals   None    Depression Screen No flowsheet data found.  Fall Risk Fall Risk  09/01/2019 06/05/2019  Falls in the past year? 0 0  Number falls in past yr: 0 -  Injury with Fall? 0 -    Any stairs in or around the home? {YES/NO:21197} If so, are there any without handrails? {YES/NO:21197} Home free of loose throw rugs in walkways, pet beds, electrical cords, etc? {YES/NO:21197} Adequate lighting in your home to reduce risk of falls? {YES/NO:21197}  ASSISTIVE DEVICES UTILIZED TO PREVENT FALLS:  Life alert? {YES/NO:21197} Use of a cane, walker or w/c? {YES/NO:21197} Grab bars in the bathroom? {YES/NO:21197} Shower chair or bench in shower? {YES/NO:21197} Elevated toilet seat or a handicapped toilet? {YES/NO:21197}  TIMED UP AND GO:  Was the test performed? {YES/NO:21197}.  Length of time to ambulate 10 feet: *** sec.   {Appearance of ZOXW:9604540}  Cognitive Function: Ad8 score reviewed for issues:  Issues making decisions:  Less interest in hobbies / activities:  Repeats questions, stories (family complaining):  Trouble using ordinary  gadgets (microwave, computer, phone):  Forgets the month or year:   Mismanaging finances:   Remembering appts:  Daily problems with thinking and/or memory: Ad8 score is=       Montreal Cognitive Assessment  06/05/2019  Visuospatial/ Executive (0/5) 1  Naming (0/3) 2  Attention: Read list of digits (0/2) 2  Attention: Read list of letters (0/1) 0  Attention: Serial 7 subtraction starting at 100 (0/3) 0  Language: Repeat phrase (0/2) 0  Language : Fluency (0/1) 0  Abstraction (0/2) 0  Delayed Recall (0/5) 0  Orientation (0/6) 2  Total 7  Adjusted Score (based on education) 8      Immunizations Immunization History  Administered Date(s) Administered  . Influenza, High Dose Seasonal PF 10/08/2017, 09/01/2018  . Janssen (J&J) SARS-COV-2 Vaccination 04/02/2020  .  Pneumococcal Conjugate-13 02/14/2018, 05/29/2018  . Pneumococcal Polysaccharide-23 05/03/2019  . Tdap 02/14/2018    TDAP status: Up to date Flu Vaccine status: Up to date Pneumococcal vaccine status: Up to date Covid-19 vaccine status: Completed vaccines  Qualifies for Shingles Vaccine? {YES/NO:21197}  Zostavax completed {YES/NO:21197}  {Shingrix Completed?:2101804}  Screening Tests Health Maintenance  Topic Date Due  . COLONOSCOPY  Never done  . MAMMOGRAM  02/18/2020  . INFLUENZA VACCINE  06/30/2020  . TETANUS/TDAP  02/15/2028  . DEXA SCAN  Completed  . COVID-19 Vaccine  Completed  . Hepatitis C Screening  Completed  . PNA vac Low Risk Adult  Completed    Health Maintenance  Health Maintenance Due  Topic Date Due  . COLONOSCOPY  Never done  . MAMMOGRAM  02/18/2020    {Colorectal cancer screening:2101809} {Mammogram status:21018020} Bone Density status: Completed 02/17/18. Results reflect: Bone density results: OSTEOPENIA. Repeat every 2 years.  Lung Cancer Screening: (Low Dose CT Chest recommended if Age 72-80 years, 30 pack-year currently smoking OR have quit w/in 15years.) does qualify.    Lung Cancer Screening Referral: ***  Additional Screening:  Hepatitis C Screening: does qualify; Completed 02/14/18  Vision Screening: Recommended annual ophthalmology exams for early detection of glaucoma and other disorders of the eye. Is the patient up to date with their annual eye exam?  {YES/NO:21197} Who is the provider or what is the name of the office in which the patient attends annual eye exams? *** If pt is not established with a provider, would they like to be referred to a provider to establish care? {YES/NO:21197}.   Dental Screening: Recommended annual dental exams for proper oral hygiene  Community Resource Referral / Chronic Care Management: CRR required this visit?  {YES/NO:21197}  CCM required this visit?  {YES/NO:21197}     Plan:     I have personally reviewed and noted the following in the patient's chart:   . Medical and social history . Use of alcohol, tobacco or illicit drugs  . Current medications and supplements . Functional ability and status . Nutritional status . Physical activity . Advanced directives . List of other physicians . Hospitalizations, surgeries, and ER visits in previous 12 months . Vitals . Screenings to include cognitive, depression, and falls . Referrals and appointments  In addition, I have reviewed and discussed with patient certain preventive protocols, quality metrics, and best practice recommendations. A written personalized care plan for preventive services as well as general preventive health recommendations were provided to patient.    Due to this being a telephonic visit, the after visit summary with patients personalized plan was offered to patient via mail or my-chart. ***Patient declined at this time./ Patient would like to access on my-chart/ per request, patient was mailed a copy of AVS./ Patient preferred to pick up at office at next visit.  [place], [place]   05/27/2020   Nurse Notes: ***

## 2020-05-28 ENCOUNTER — Ambulatory Visit: Payer: Medicare HMO | Admitting: *Deleted

## 2020-06-24 ENCOUNTER — Telehealth: Payer: Self-pay

## 2020-06-24 NOTE — Telephone Encounter (Signed)
error 

## 2020-06-26 ENCOUNTER — Ambulatory Visit (INDEPENDENT_AMBULATORY_CARE_PROVIDER_SITE_OTHER): Payer: Medicare HMO | Admitting: Family Medicine

## 2020-06-26 ENCOUNTER — Encounter: Payer: Self-pay | Admitting: Family Medicine

## 2020-06-26 ENCOUNTER — Other Ambulatory Visit: Payer: Self-pay

## 2020-06-26 VITALS — BP 104/68 | HR 86 | Temp 98.7°F | Ht 63.0 in | Wt 118.2 lb

## 2020-06-26 DIAGNOSIS — I6523 Occlusion and stenosis of bilateral carotid arteries: Secondary | ICD-10-CM

## 2020-06-26 DIAGNOSIS — G3184 Mild cognitive impairment, so stated: Secondary | ICD-10-CM

## 2020-06-26 LAB — COMPREHENSIVE METABOLIC PANEL
ALT: 10 U/L (ref 0–35)
AST: 11 U/L (ref 0–37)
Albumin: 4.1 g/dL (ref 3.5–5.2)
Alkaline Phosphatase: 48 U/L (ref 39–117)
BUN: 13 mg/dL (ref 6–23)
CO2: 32 mEq/L (ref 19–32)
Calcium: 9.2 mg/dL (ref 8.4–10.5)
Chloride: 106 mEq/L (ref 96–112)
Creatinine, Ser: 0.79 mg/dL (ref 0.40–1.20)
GFR: 72.39 mL/min (ref >60.00)
Glucose, Bld: 114 mg/dL — ABNORMAL HIGH (ref 70–99)
Potassium: 3.5 mEq/L (ref 3.5–5.1)
Sodium: 141 mEq/L (ref 135–145)
Total Bilirubin: 0.5 mg/dL (ref 0.2–1.2)
Total Protein: 6.3 g/dL (ref 6.0–8.3)

## 2020-06-26 LAB — TSH: TSH: 0.78 u[IU]/mL (ref 0.35–4.50)

## 2020-06-26 LAB — VITAMIN B12: Vitamin B-12: 235 pg/mL (ref 211–911)

## 2020-06-26 MED ORDER — ATORVASTATIN CALCIUM 20 MG PO TABS: 20.0000 mg | ORAL_TABLET | Freq: Every day | ORAL | 3 refills | Status: DC

## 2020-06-26 NOTE — Progress Notes (Signed)
Chief Complaint  Patient presents with  . Follow-up    Subjective: Patient is a 69 y.o. female here for f/u.   The patient's Namenda was increased from 5 mg twice daily to 10 mg twice daily.  She has no side effects from this change though notices no significant improvements.  Her husband notes that her memory continues to worsen.  They were able to get a cancellation appointment with the neurology team in 5 days.  She is compliant with her medications.  She is no longer taking Zoloft.  Her mood has been fine since coming off of this.  She is smoking around a third of a pack daily and is not exercising.  She is reading intermittently.  Past Medical History:  Diagnosis Date  . Bilateral carotid artery stenosis 01/05/2018  . Estrogen deficiency 02/14/2018  . High cholesterol   . Hyperlipidemia 10/06/2019  . Mild neurocognitive disorder 10/06/2019  . Obstructive sleep apnea   . Right-sided Bell's palsy 12/27/2017  . Stroke    Asymptomatic ischemic left cerebellar stroke    Objective: BP 104/68 (BP Location: Left Arm, Patient Position: Sitting, Cuff Size: Normal)   Pulse 86   Temp 98.7 F (37.1 C) (Oral)   Ht 5\' 3"  (1.6 m)   Wt 118 lb 4 oz (53.6 kg)   SpO2 98%   BMI 20.95 kg/m  General: Awake, appears stated age HEENT: MMM, EOMi Heart: RRR, no lower extremity edema Lungs: CTAB, no rales, wheezes or rhonchi. No accessory muscle use Neuro: She is alert to person and the fact that this is a doctor's office.  She is unsure what group we are affiliated with.  Does not know the date or who the president is. Psych: Limited judgment and insight, normal affect and mood  Assessment and Plan: Mild neurocognitive disorder - Plan: TSH, Comprehensive metabolic panel, B12  Bilateral carotid artery stenosis - Plan: atorvastatin (LIPITOR) 20 MG tablet  We will update labs.  Counseled on exercise.  Continue to do mentally stimulating activities.  Continue Namenda and Aricept.  She has an appointment  with neurology in 5 days.  We will hold off on changing any medications in light of this. I will see her in 6 months for her annual visit or as needed. The patient and her husband voiced understanding and agreement to the plan.  [place], DO 06/26/20  11:46 AM

## 2020-06-26 NOTE — Patient Instructions (Signed)
Keep reading and doing mentally stimulating things.  Aim to do some physical exertion for 150 minutes per week. This is typically divided into 5 days per week, 30 minutes per day. The activity should be enough to get your heart rate up. Anything is better than nothing if you have time constraints.  Give [place] 2-3 business days to get the results of your labs back.   Let us know if you need anything.

## 2020-07-01 ENCOUNTER — Ambulatory Visit: Payer: Medicare HMO | Admitting: Neurology

## 2020-07-01 ENCOUNTER — Encounter: Payer: Self-pay | Admitting: Neurology

## 2020-07-01 ENCOUNTER — Other Ambulatory Visit: Payer: Self-pay

## 2020-07-01 ENCOUNTER — Telehealth: Payer: Self-pay

## 2020-07-01 VITALS — BP 188/80 | HR 60 | Ht 63.0 in | Wt 121.2 lb

## 2020-07-01 DIAGNOSIS — I6523 Occlusion and stenosis of bilateral carotid arteries: Secondary | ICD-10-CM

## 2020-07-01 DIAGNOSIS — G3184 Mild cognitive impairment, so stated: Secondary | ICD-10-CM

## 2020-07-01 NOTE — Telephone Encounter (Signed)
Called to give carotid US appt time, says 8/5 not convenient, re-scheduled for 07/08/20 @ 9:40 AM

## 2020-07-01 NOTE — Patient Instructions (Signed)
Check ultrasound carotids Neurocognitive testing in December with Dr. Milbert Coulter  Follow-up with me after testing

## 2020-07-01 NOTE — Progress Notes (Signed)
Follow-up Visit   Date: 07/01/20   MAREESA Ross MRN: [id number] DOB: 04/07/1951   Interim History: Mikayla Ross is a 69 y.o. right-handed female with history of R Bell's palpsy, hyperlipidemia, OSA, and tobacco use returning to the clinic for follow-up of mild cognitive impairment and bilateral carotid stenosis.  Mikayla Ross is accompanied by Mikayla Ross husband.    History of present illness: Mikayla Ross became the primary care giver to Mikayla Ross 75 year-old father around 2018.  Since this time, Mikayla Ross and Mikayla Ross husband have noticed that Mikayla Ross is more forgetful and often misplaces items.  Mikayla Ross quickly forgets details of a conversation within minutes and often repeats herself.  Mikayla Ross cannot complete tasks. Mikayla Ross cooks microwave meals.  Mikayla Ross does not use the stove or oven.  Mikayla Ross does not manage finances.  Mikayla Ross manages Mikayla Ross father's medications, but tends to check herself multiple times.  Mikayla Ross mood is "terrible" due to stress related to caring for Mikayla Ross 36 year-old father who is blind and bedridden.  Mikayla Ross does not have any siblings.    Mikayla Ross stopped driving 1 year ago because Mikayla Ross husband did not feel Mikayla Ross was safe.  Mikayla Ross does not have any hobbies.  Mikayla Ross enjoys spending time with Mikayla Ross husband, but is not able to see him often as Mikayla Ross is living with Mikayla Ross father.  Mikayla Ross endorses feeling depressed.   UPDATE 09/01/2019: At Mikayla Ross last visit in July, MRI brain was ordered to evaluate for memory changes and showed chronic left cerebellar ischemic changes as well as chronic microvascular ischemic changes.  Patient denies having history of stroke or acute change in equilibrium, vision, or coordination.  Mikayla Ross was started on aspirin 81 mg and has been compliant with this.  Mikayla Ross denies any new neurological complaints.  Mikayla Ross continues to have ongoing issues with being forgetful and difficulty with task completion.  Mikayla Ross appears very frustrated with Mikayla Ross home situation to have to care for Mikayla Ross elderly father.  Mikayla Ross continues to feel depressed and overwhelmed.  UPDATE  07/01/2020:  Mikayla Ross is here for follow-up.  Mikayla Ross has moved back home after Mikayla Ross father (50 years old) passed away in 04-24-23 who Mikayla Ross was the primary care giver. Husband has noticed that Mikayla Ross memory has progressively been getting worse and Mikayla Ross is increasingly forgetful.  Mikayla Ross is very sedentary and does not perform any household chores.  Husband manages medications in a pillbox and then told Mikayla Ross to take them, however Mikayla Ross may forget which days of the week Mikayla Ross is taken medications.  Mikayla Ross is able to bathe, dress, and feed herself.  Mikayla Ross is very sedentary watches TV or sits in the portal day.  Husband has tried to engage Mikayla Ross with puzzles, but Mikayla Ross does not have interest in doing them.  Mikayla Ross enjoys reading occasionally.  Mood has been "up and down".  Husband is getting frustrated with Mikayla Ross cognitive changes as he is the primary caregiver.  He has asked Mikayla Ross to do light skipping such as dusting and vacuuming, but Mikayla Ross does not do this.   Medications:  Current Outpatient Medications on File Prior to Visit  Medication Sig Dispense Refill  . atorvastatin (LIPITOR) 20 MG tablet Take 1 tablet (20 mg total) by mouth daily. 90 tablet 3  . donepezil (ARICEPT) 10 MG tablet Take 1 tablet (10 mg total) by mouth at bedtime. 90 tablet 2  . memantine (NAMENDA) 10 MG tablet Take 1 tablet (10 mg total) by mouth 2 (two) times daily. 60 tablet 3  . Multiple Vitamin (  MULTIVITAMIN) tablet Take 1 tablet by mouth daily.     No current facility-administered medications on file prior to visit.    Allergies: No Known Allergies  Review of Systems:  CONSTITUTIONAL: No fevers, chills, night sweats, or weight loss.  EYES: No visual changes or eye pain ENT: No hearing changes.  No history of nose bleeds.   RESPIRATORY: No cough, wheezing and shortness of breath.   CARDIOVASCULAR: Negative for chest pain, and palpitations.   GI: Negative for abdominal discomfort, blood in stools or black stools.  No recent change in bowel habits.   GU:  No history of  incontinence.   MUSCLOSKELETAL: No history of joint pain or swelling.  No myalgias.   SKIN: Negative for lesions, rash, and itching.   ENDOCRINE: Negative for cold or heat intolerance, polydipsia or goiter.   PSYCH:  + depression or anxiety symptoms.   NEURO: As Above.   Vital Signs:  BP (!) 188/80   Pulse 60   Ht 5\' 3"  (1.6 m)   Wt 121 lb 3.2 oz (55 kg)   SpO2 99%   BMI 21.47 kg/m    Neurological Exam: MENTAL STATUS including orientation to time, place -Mikayla Ross does not know the year, says the president is Trump.  Mikayla Ross is unaware of the month.  Mikayla Ross incorrectly identified Mikayla Ross home address and does not know Mikayla Ross home telephone number.  There is paucity of speech, Mikayla Ross appears depressed.  Speech is not dysarthric.  CRANIAL NERVES:   Pupils equal round and reactive to light.  Normal conjugate, extra-ocular eye movements in all directions of gaze.  No ptosis   MOTOR:  Motor strength is 5/5 in all extremities.    COORDINATION/GAIT:  Normal finger-to- nose-finger.  Intact rapid alternating movements bilaterally.  Gait narrow based and stable.   Data: MRI brain 07/12/2019: 1.  No acute intracranial abnormality. 2. Chronic ischemic disease in the left cerebellum. And chronic bilateral cerebral hemisphere small vessel ischemic disease Suspected.  Ultrasound carotid 06/09/2019: Right Carotid: Velocities in the right ICA are consistent with a 40-59% stenosis.                Non-hemodynamically significant plaque <50% noted in the CCA.                The ECA appears >50% stenosed.                The RICA velocities are elevated and stable compared to the prior exam.  Left Carotid: Velocities in the left ICA are consistent with a 40-59% stenosis, based on peak systolic velocities and plaque formation.                 Non-hemodynamically significant plaque noted in the CCA.               The LICA velocities are elevated and stable compared to the prior exam.  Vertebrals:  Right vertebral artery  demonstrates antegrade flow. Small caliber left vertebral artery with atypical, antegrade flow. Subclavians: Bilateral subclavian artery flow was disturbed.  *See table(s) above for measurements and observations. Suggest follow up study in 12 months.   Lab Results  Component Value Date   CHOL 191 05/03/2019   HDL 66.00 05/03/2019   LDLCALC 108 (H) 05/03/2019   TRIG 85.0 05/03/2019   CHOLHDL 3 05/03/2019   Neuropsychological testing 10/05/2020: Mild neurocognitive disorder to the severe end of the spectrum  IMPRESSION/PLAN: 1.  Neurocognitive disorder, progressive.  I suspect Mikayla Ross is transitioning  into dementia given Mikayla Ross inability to perform IADLs and some ADLs.  Discussed at length about management options and the fact that medications do not reverse memory changes, unfortunately there is no effective treatment.  Management is symptomatic.  I offered him caregiver services through home health, which he declined as he is able to manage things at home at this time. - Repeat neuropsyhoclogical testing given progressive changes - Continue donepizil 10mg  daily and Namenda 10mg  daily  2.  Bilateral carotid stenosis, moderate (40-59%) - Check carotids for surveillance  3.  History of ischemic left cerebellar stroke, age uncertain, asymptomatic. Continue aspirin 81 mg daily and statin therapy Patient not interested in tobacco cessation.    Return to clinic in 6 months  Greater than 50% of this 30 minute visit was spent in counseling, explanation of diagnosis, planning of further management, and coordination of care.   Thank you for allowing me to participate in patient's care.  If I can answer any additional questions, I would be pleased to do so.    Sincerely,    Daleah Coulson K. , DO

## 2020-07-02 ENCOUNTER — Other Ambulatory Visit: Payer: Self-pay | Admitting: Family Medicine

## 2020-07-02 DIAGNOSIS — F419 Anxiety disorder, unspecified: Secondary | ICD-10-CM

## 2020-07-02 DIAGNOSIS — F32A Depression, unspecified: Secondary | ICD-10-CM

## 2020-07-02 DIAGNOSIS — F329 Major depressive disorder, single episode, unspecified: Secondary | ICD-10-CM

## 2020-07-02 DIAGNOSIS — G3184 Mild cognitive impairment, so stated: Secondary | ICD-10-CM

## 2020-07-04 ENCOUNTER — Other Ambulatory Visit: Payer: Medicare HMO

## 2020-07-08 ENCOUNTER — Ambulatory Visit
Admission: RE | Admit: 2020-07-08 | Discharge: 2020-07-08 | Disposition: A | Discharge status: Home / Self Care | Payer: Medicare HMO | Source: Ambulatory Visit | Attending: Neurology | Admitting: Neurology

## 2020-07-08 DIAGNOSIS — I6523 Occlusion and stenosis of bilateral carotid arteries: Secondary | ICD-10-CM

## 2020-07-19 ENCOUNTER — Other Ambulatory Visit: Payer: Self-pay

## 2020-07-19 MED ORDER — ATORVASTATIN CALCIUM 40 MG PO TABS
40.0000 mg | ORAL_TABLET | Freq: Every day | ORAL | 0 refills | Status: DC
Start: 2020-07-19 — End: 2020-11-01

## 2020-07-31 ENCOUNTER — Ambulatory Visit (INDEPENDENT_AMBULATORY_CARE_PROVIDER_SITE_OTHER): Payer: Medicare HMO | Admitting: Psychology

## 2020-07-31 ENCOUNTER — Other Ambulatory Visit: Payer: Self-pay

## 2020-07-31 ENCOUNTER — Encounter: Payer: Self-pay | Admitting: Psychology

## 2020-07-31 ENCOUNTER — Ambulatory Visit: Payer: Medicare HMO

## 2020-07-31 DIAGNOSIS — I639 Cerebral infarction, unspecified: Secondary | ICD-10-CM | POA: Diagnosis not present

## 2020-07-31 DIAGNOSIS — F0150 Vascular dementia without behavioral disturbance: Secondary | ICD-10-CM

## 2020-07-31 DIAGNOSIS — G3184 Mild cognitive impairment, so stated: Secondary | ICD-10-CM

## 2020-07-31 DIAGNOSIS — R69 Illness, unspecified: Secondary | ICD-10-CM | POA: Diagnosis not present

## 2020-07-31 DIAGNOSIS — F0390 Unspecified dementia without behavioral disturbance: Secondary | ICD-10-CM

## 2020-07-31 HISTORY — DX: Unspecified dementia, unspecified severity, without behavioral disturbance, psychotic disturbance, mood disturbance, and anxiety: F03.90

## 2020-07-31 NOTE — Progress Notes (Signed)
   Psychometrician Note   Cognitive testing was administered to Mikayla Ross by Shan Levans, B.S. (psychometrist) under the supervision of Dr. Newman Nickels, Ph.D., licensed psychologist on 07/31/2020. Mikayla Ross did not appear overtly distressed by the testing session per behavioral observation or responses across self-report questionnaires. Dr. Newman Nickels, Ph.D. checked in with Mikayla Ross as needed to manage any distress related to testing procedures (if applicable). Rest breaks were offered.    The battery of tests administered was selected by Dr. Newman Nickels, Ph.D. with consideration to Mikayla Ross's current level of functioning, the nature of her symptoms, emotional and behavioral responses during interview, level of literacy, observed level of motivation/effort, and the nature of the referral question. This battery was communicated to the psychometrist. Communication between Dr. Newman Nickels, Ph.D. and the psychometrist was ongoing throughout the evaluation and Dr. Newman Nickels, Ph.D. was immediately accessible at all times. Dr. Newman Nickels, Ph.D. provided supervision to the psychometrist on the date of this service to the extent necessary to assure the quality of all services provided.    Mikayla Ross will return within approximately 1-2 weeks for an interactive feedback session with Dr. Milbert Coulter at which time her test performances, clinical impressions, and treatment recommendations will be reviewed in detail. Mikayla Ross understands she can contact our office should she require our assistance before this time.  A total of 150 minutes of billable time were spent face-to-face with Mikayla Ross by the psychometrist. This includes both test administration and scoring time. Billing for these services is reflected in the clinical report generated by Dr. Newman Nickels, Ph.D..  This note reflects time spent with the psychometrician and does not include test scores or any clinical  interpretations made by Dr. Milbert Coulter. The full report will follow in a separate note.

## 2020-07-31 NOTE — Progress Notes (Signed)
NEUROPSYCHOLOGICAL EVALUATION . [facility] Department of Neurology  Date of Evaluation: July 31, 2020  Reason for Referral:   Mikayla Ross is a 69 y.o. right-handed Caucasian female referred by Nita Sickle, D.O., to characterize her current cognitive functioning and assist with diagnostic clarity and treatment planning in the context of a prior diagnosis of a mild neurocognitive disorder (severe end of this spectrum) and concerns for continued cognitive decline.   Assessment and Plan:   Clinical Impression(s): Mikayla Ross' pattern of performance is suggestive of diffuse cognitive impairment, often in the exceptionally low normative ranges. A relative strength was exhibited across basic attention. However, all other cognitive domains were exceptionally low relative to age-matched peers. Test results were largely stable relative to her previous evaluation. Importantly, this does not suggest the absence of cognitive or functional decline. Results during the previous evaluation were low to the extent that achieving measurable decline was very unlikely. Since her previous evaluation, Mikayla Ross' husband reported continued cognitive decline and evolving difficulties completing instrumental activities of daily living (ADLs), especially surrounding medication and financial management, as well as driving. This, coupled with evidence for significant cognitive dysfunction described above, suggests that she now meets criteria for a Major Neurocognitive Disorder (formerly "dementia").  The etiology of her cognitive deficits remains unclear, in large part due to there being no discernable pattern seen across cognitive testing with all scores significantly below expectation. There does seem to be evidence for cognitive and functional decline, especially given her husband's report of diminished abilities to perform ADLs independently. This decline would suggest the presence of a  neurodegenerative illness. Alzheimer's disease would represent the most likely culprit given base rates of this condition in the general public. Mikayla Ross also did not report behavioral symptoms (e.g., fully-formed visual hallucinations, REM sleep behaviors, parkinsonian features, personality changes, or speech production difficulties) consistent with other more common neurodegenerative illnesses like Lewy body or frontotemporal dementia presentations. She does have a history of cardiovascular illness and a prior left cerebellar stroke. However, cognitive deficits are far more pronounced than what would be expected with a vascular etiology alone. Vascular presentations also tend to be more stable and evidence for functional decline favors the presence of a neurodegenerative illness. No significant mood-related symptoms were reported and her mood does seem to have subtly improved since November. Continued medical monitoring will be important moving forward.   Recommendations: If not already done, I would strongly advise that Mikayla Ross and her husband discuss her wishes regarding durable power of attorney and medical decision making so that she can have input into these choices. Additionally, they should discuss future plans for caretaking and seek out community options for in home/residential care should they become necessary. Should they require assistance in these areas, I would recommend that they contact Ward Givens at the [facility] [phone]) and schedule a consultation.   Should there be a continued progression of her current deficits over time, Mikayla Ross is unlikely to regain any independent living skills lost. Therefore, it is recommended that she remain as involved as possible in all aspects of household chores, finances, and medication management, with supervision to ensure adequate performance. She will likely benefit from the establishment and maintenance of a routine in order  to maximize her functional abilities over time.  It will be important for Mikayla Ross to have another person with her when in situations where she may need to process information, weigh the pros and cons of  different options, and make decisions, in order to ensure that she fully understands and recalls all information to be considered.  Mikayla Ross is encouraged to attend to lifestyle factors for brain health (e.g., regular physical exercise, good nutrition habits, regular participation in cognitively-stimulating activities, and general stress management techniques), which are likely to have benefits for both emotional adjustment and cognition. Optimal control of vascular risk factors (including safe cardiovascular exercise and adherence to dietary recommendations) is encouraged.   Information important to remember should be provided in written format in all instances. This should be placed in a highly visible and commonly frequented location in her residence to help promote recall.   To address problems with processing speed, she may wish to consider:   -Ensuring that she is alerted when essential material or instructions are being presented   -Adjusting the speed at which new information is presented   -Allowing for more time in comprehending, processing, and responding in conversation  To address problems with fluctuating attention, she may wish to consider:   -Avoiding external distractions when needing to concentrate   -Limiting exposure to fast paced environments with multiple sensory demands   -Writing down complicated information and using checklists   -Attempting and completing one task at a time (i.e., no multi-tasking)   -Verbalizing aloud each step of a task to maintain focus   -Taking frequent breaks during the completion of steps/tasks to avoid fatigue   -Reducing the amount of information considered at one time  Review of Records:   Mikayla Ross completed a comprehensive  neuropsychological evaluation with myself on 10/06/2019. Results at that time revealed diffuse cognitive impairment, often in the severe ranges. A relative strength was exhibited learning novel visual information. However, despite normative scores suggesting adequate retention of this material, Mikayla Ross was only able to retain 1/5 shapes previously learned. Overall, performance was impaired across domains of processing speed, attention/concentration, executive functioning, receptive and expressive language, visuospatial abilities, and learning and memory. Despite this level of impairment, Mikayla Ross and her husband reported a generally intact ability to perform activities of daily living (ADLs). As such, she meets diagnostic criteria for a Mild Neurocognitive Disorder (formerly "mild cognitive impairment") but is likely towards the severe end of this spectrum based upon current results. The etiology of Mikayla Ross' deficits was unclear and far more advanced than expected. There was the potential for a vascular component given neuroimaging suggesting a chronic ischemic left cerebellar stroke, as well as chronic microvascular changes and carotid stenosis. However, the location of her stroke and the presence of mild microvascular changes did not explain the extent of impairment. There was also potential for a "mixed dementia" presentation (i.e., vascular and Alzheimer's disease etiologies) given assumed evidence for cognitive decline and largely amnestic memory performance. However, the diffuse nature of her cognitive impairments did not yield discernible patterns for consideration. Current impairments are also above and beyond expected contributions from mood-related factors or untreated obstructive sleep apnea alone.  Mikayla Ross was most recently seen by [facility] Nita Sickle, D.O.) on 07/01/2020 for follow-up of cognitive dysfunction. Recent updates included Mikayla Ross moving back home after her father passed  away in May whom she served as the primary caregiver for. Her husband reported that her memory has progressively gotten worse and she is increasingly forgetful. She is very sedentary and does not perform any household chores. Her husband now manages medications in a pillbox and then instructs her to take them. However, she may forget which days  of the week she has taken medications. She is still able to bathe, dress, and feed herself. Her husband has tried to engage her with puzzles but she does not have interest in doing them. Her mood has been "up and down." Ultimately, given some changes in her functional status, Mikayla Ross was referred for a repeat neuropsychological evaluation to characterize her cognitive abilities and to assist with diagnostic clarity and treatment planning.   Brain MRI on 07/12/2019 revealed a chronic ischemic left cerebellar stroke, as well as chronic microvascular changes. Carotid ultrasound on 06/09/2019 revealed 40-59% stenosis in both the right and left ICA, said to be stable.  Past Medical History:  Diagnosis Date  . Bilateral carotid artery stenosis 01/05/2018  . Estrogen deficiency 02/14/2018  . High cholesterol   . Hyperlipidemia 10/06/2019  . Mild neurocognitive disorder 10/06/2019  . Obstructive sleep apnea   . Right-sided Bell's palsy 12/27/2017  . Stroke    Asymptomatic ischemic left cerebellar stroke    Past Surgical History:  Procedure Laterality Date  . ABDOMINAL HYSTERECTOMY    . APPENDECTOMY    . TONSILLECTOMY      Current Outpatient Medications:  .  atorvastatin (LIPITOR) 20 MG tablet, Take 1 tablet (20 mg total) by mouth daily., Disp: 90 tablet, Rfl: 3 .  atorvastatin (LIPITOR) 40 MG tablet, Take 1 tablet (40 mg total) by mouth daily., Disp: 90 tablet, Rfl: 0 .  donepezil (ARICEPT) 10 MG tablet, TAKE 1 TABLET BY MOUTH AT BEDTIME, Disp: 30 tablet, Rfl: 0 .  memantine (NAMENDA) 10 MG tablet, Take 1 tablet (10 mg total) by mouth 2 (two) times daily., Disp:  60 tablet, Rfl: 3 .  Multiple Vitamin (MULTIVITAMIN) tablet, Take 1 tablet by mouth daily., Disp: , Rfl:  .  sertraline (ZOLOFT) 50 MG tablet, TAKE 1/2 (ONE-HALF) TABLET BY MOUTH DAILY FOR FIRST 2 WEEKS, THEN TAKE 1 TABLET BY MOUTH DAILY, Disp: 30 tablet, Rfl: 0  Clinical Interview:   Cognitive Symptoms: Decreased short-term memory: Endorsed. Provided examples included misplacing objects, repeating herself, and trouble remembering the names of familiar individuals and details of previous conversations. Her husband also reported that she will frequently enter a room and forget her original intention or where she was going.  Decreased long-term memory: Denied. Decreased attention/concentration: Denied. However, her husband reported Ms. Santacroce having significant trouble maintaining her focus, exhibiting increased ease of distractibility, and frequently losing her train of thought while conversing with others.  Reduced processing speed: Denied. However, her husband endorsed that Ms. Krasner exhibits diminished processing speed.  Difficulties with executive functions: Endorsed. Difficulties surrounding organization/complex planning and indecisiveness were reported. Her husband also described instances where Ms. Lacuesta has acted impulsively. However, no specific examples were provided. Overt personality changes were denied.  Difficulties with emotion regulation: Denied. Difficulties with receptive language: Denied. Difficulties with word finding: Endorsed. Decreased visuoperceptual ability: Denied.  Trajectory of deficits: Deficits were said to first become noticeable in 2018 when Ms. Salata started caring for her 68 year old father who was bedridden and blind; depressive symptoms were also said to start around this time. Since taking over as caregiver, Ms. Melvyn NethLewis' husband reported a noticeable decline in her cognitive abilities over the years. Since her previous evaluation in November 2020, Ms. Melvyn NethLewis' husband  reported a broad and significant decline in her cognitive abilities (especially short-term memory) and day-to-day functioning.   Difficulties completing ADLs: Endorsed. In the time since her previous evaluation, Ms. Melvyn NethLewis' husband reported that he now fully manages her  medications and must instruct her of what she must take and when. Despite this, they reported that she will sometimes forget when she has taken her medications. Her husband also has taken over financial management and bill paying. She has stopped driving chiefly due to cognitive concerns.   Additional Medical History: History of traumatic brain injury/concussion: Denied. History of stroke: Medical records and neuroimaging suggest a remote history of an asymptomatic ischemic left cerebellar stroke.  History of seizure activity: Denied. History of known exposure to toxins: Denied. Symptoms of chronic pain: Denied. Experience of frequent headaches/migraines: Denied. Frequent instances of dizziness/vertigo: Denied.  Sensory changes: Denied. Balance/coordination difficulties: Denied. She continued to describe her balance as "good." She did acknowledge occasionally tripping over things in her environment, largely due to infrequent dizzy spells.  Other motor difficulties: Tremulous behaviors were reported to occur in her upper extremities. However, these were said to only occur during periods of high stress or aggravation.   Sleep History: Estimated hours obtained each night: 6-8 hours. Difficulties falling asleep: Denied. Difficulties staying asleep: Denied. Feels rested and refreshed upon awakening: Endorsed.  History of snoring: Denied. History of waking up gasping for air: Denied. Witnessed breath cessation while asleep: Endorsed. Her husband previously reported witnessing Ms. Cooperwood stop breathing temporarily while asleep across several occasions. Medical records also suggest a history of obstructive sleep apnea. Ms. Proffit  did not endorse using a CPAP machine to treat this condition.   History of vivid dreaming: Denied. Excessive movement while asleep: Denied. Instances of acting out her dreams: Denied.  Psychiatric/Behavioral Health History: Depression: Denied. During the current evaluation, Ms. Mian and her husband denied prior mental health concerns or potential diagnoses. During the previous evaluation, some depressive symptoms were acknowledged, attributed to Ms. Leiterman having served as primary caregiver to her father since 2018. She previously described her mood as "terrible" and acknowledged symptoms of frustration and depression. She denied having any coping mechanisms for dealing with mood concerns outside of "pushing through." However, as her father passed away and she no longer has this role, it is possible that mood-related symptoms have improved lately. Current or remote suicidal ideation, intent, or plan was denied.  Anxiety: Endorsed. Symptoms were said to occur occasionally depending on various life-related stressors. Mania: Denied. Trauma History: Denied. Visual/auditory hallucinations: Denied. Delusional thoughts: Denied.  Tobacco: Endorsed. Ms. Saner estimated consuming approximately 8 cigarettes per day.  Alcohol: She denied current alcohol consumption as well as a history of problematic alcohol use, abuse, or dependence.  Recreational drugs: Denied. Caffeine: 1-2 cups of coffee in the morning.   Family History: Problem Relation Age of Onset  . Renal Disease Mother   . Seizures Father   . Renal Disease Father   . Cancer Neg Hx    This information was confirmed by Ms. Kinner.  Academic/Vocational History: Highest level of educational attainment: Unclear. During the previous interview, Ms. Ratterree reported completing high school. However, when testing, she stated that she only completed up to the 10th grade. Across the current evaluation, she again stated that she completed high school.  As such, an education level of 12 was utilized for normative purposes. She described herself as an average B/C student. She initially denied any relative academic weaknesses; however, her husband noted that mathematics represented a weakness while in school.  History of developmental delay: Denied. History of grade repetition: Denied. However, her husband stated that she had to repeat the 5th grade. Enrollment in special education courses: Denied. History of diagnosed specific  learning disability: Denied. History of ADHD: Denied.  Employment: Retired. Ms. Leonhart was unable to accurately report her employment history. 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  Evaluation Results:   Behavioral Observations: Ms. Trefry was accompanied by her husband, arrived to her appointment on time, and was appropriately dressed and groomed. She appeared alert but it was difficult to assess if she was fully oriented. She did not remember myself or previously completing a neuropsychological evaluation in November 2020. Observed gait and station were within normal limits. Gross motor functioning appeared intact upon informal observation and no abnormal movements (e.g., tremors) were noted. Her affect was generally relaxed and positive, but did range appropriately given the subject being discussed during the clinical interview or the task at hand during testing procedures. Spontaneous speech was fluent and word finding difficulties were not observed during the clinical interview. Thought processes were tangential but generally coherent and normal in content. She was a limited historian and often deferred to her husband for assistance in answering questions. Insight into her cognitive difficulties appeared limited given the extent of previously documented cognitive dysfunction. During testing, sustained attention was appropriate. Task engagement was adequate and  she persisted when challenged. However, she often exhibited significant difficulty comprehending task instructions requiring numerous tasks to be discontinued (see below). Overall, Ms. Bultman was cooperative with the clinical interview and subsequent testing procedures.   Adequacy of Effort: The validity of neuropsychological testing is limited by the extent to which the individual being tested may be assumed to have exerted adequate effort during testing. Ms. Granato expressed her intention to perform to the best of her abilities and exhibited adequate task engagement and persistence. Scores across stand-alone and embedded performance validity measures were consistently below expectation. However, this is believed to be due to true and severe cognitive dysfunction rather than poor engagement or attempts to perform poorly. For example, across Word Choice, she had significant difficulties recalling which categories she was supposed to be choosing between. She also perseverated by repeating a previous response rather than providing category cues. Across Dot Counting, she made numerous counting errors and would provide very disparate answers across the same stimuli. As such, the results of the current evaluation are believed to be a valid representation of Mikayla Ross' current cognitive functioning.  Test Results: Ms. Kolodny was very disoriented at the time of the current evaluation. She was unable to estimate her current age and could not recall her address. She was also unable to provide the current year, month ("August"), date, day of the week, time, or location.   Intellectual abilities based upon educational and vocational attainment were estimated to be in the below average range. Premorbid abilities were estimated to be within the well below average range based upon a single-word reading test.   Processing speed was exceptionally low. Across Numbers and Letters A, she would jump around without a discernable  pattern despite instructions to go one line at a time. Across TMT A, she had only made it to number 16 after time had elapsed, including instances where she stopped drawing a continuous line and made slash marks through various numbers. She was unable to keep her place across the initial conditions of Color-Word. Basic attention was average. More complex attention (e.g., working memory) was exceptionally low. Executive functioning was exceptionally low. Across Letters and Numbers B, she was unable to understand task instructions and did not attempt the numerical  calculation aspect of this task. Several other tasks (e.g., NAB Mazes, D-KEFS Color-Word and 20 Questions, and Matrix Reasoning) were discontinued as Ms. Fournet was unable to comprehend task instructions. Performance across a task assessing safety and judgment was also exceptionally low.   Assessed receptive language abilities were exceptionally low. She appeared to have trouble with understanding sequences (e.g., point to X after pointing to Y), as well as trouble understanding less common sentence structure. Assessed expressive language (e.g., verbal fluency and confrontation naming) was exceptionally low.     Assessed visuospatial/visuoconstructional abilities were exceptionally low. Her drawing of a clock included a full circle with the numbers 12, 12, 13, and 4 written in vertical fashion down the center of the circle. No clock hands were attempted.    Learning (i.e., encoding) of novel verbal and visual information was exceptionally low to well below average. Spontaneous delayed recall (i.e., retrieval) of previously learned information was also believed to be exceptionally low. Despite an average score across a shape recall task, this was likely obtained via her guessing correct responses rather than her remembering this information after a delay. Retention rates were 0% across a story learning task and 0% across a list learning task. Performance  across recognition tasks was exceptionally low, suggesting minimal to no evidence for information consolidation.   Results of emotional screening instruments suggested that recent symptoms of generalized anxiety were in the minimal range, while symptoms of depression were within normal limits. A screening instrument assessing recent sleep quality suggested the presence of minimal sleep dysfunction.  Tables of Scores:   Note: This summary of test scores accompanies the interpretive report and should not be considered in isolation without reference to the appropriate sections in the text. Descriptors are based on appropriate normative data and may be adjusted based on clinical judgment. The terms "impaired" and "within normal limits (WNL)" are used when a more specific level of functioning cannot be determined. Descriptors refer to the current evaluation only.         Effort Testing:    DESCRIPTOR   November 2020 Current    ACS Word Choice: --- --- --- Below Expectation  Dot Counting Test: --- --- --- Below Expectation  CVLT-III Forced Choice Recognition: --- --- --- Below Expectation        Cognitive Screening:             Raw Score Raw Score Percentile   SLUMS: --- 3/30 --- ---        NAB Screening Battery, Form 2 Standard Score/ T Score Standard Score/ T Score Percentile   Total Score 50 50 <1 Exceptionally Low    Orientation 14/29 14/29 --- ---  Attention Domain 52 54 <1 Exceptionally Low    Digits Forward 37 43 25 Average    Digits Backwards 29 26 1  Exceptionally Low    Letters & Numbers A Efficiency 19 19 <1 Exceptionally Low    Letters & Numbers B Efficiency 19 22 <1 Exceptionally Low  Language Domain 45 62 1 Exceptionally Low    Auditory Comprehension 19 19 <1 Exceptionally Low    Naming 19 21 <1 Exceptionally Low  Memory Domain 82 63 1 Exceptionally Low    Shape Learning Immediate Recognition 69 33 5 Well Below Average    Story Learning Immediate Recall 30 20 <1 Exceptionally  Low    Shape Learning Delayed Recognition 45 53 62 Average    Story Learning Delayed Recall 24 22 <1 Exceptionally Low  Spatial Domain 56  54 <1 Exceptionally Low    Visual Discrimination 19 19 <1 Exceptionally Low    Design Construction 30 28 2  Exceptionally Low  Executive Functions Domain 59 57 <1 Exceptionally Low    Mazes 28 26 1  Exceptionally Low    Word Generation 26 23 <1 Exceptionally Low        Intellectual Functioning:             Standard Score Standard Score Percentile   Test of Premorbid Functioning: 77 76 5 Well Below Average        Memory:            [place] Verbal Learning Test (CVLT-III) Brief Form: Raw Score Raw Score (Scaled/Standard Score) Percentile     Total Trials 1-4 1/36 9/36 (45) <1 Exceptionally Low    Short-Delay Free Recall 0/9 0/9 (1) <1 Exceptionally Low    Long-Delay Free Recall 0/9 0/9 (1) <1 Exceptionally Low    Long-Delay Cued Recall 0/9 1/9 (1) <1 Exceptionally Low      Recognition Hits 8/9 8/9 (9) 37 Average      False Positive Errors 13 13 (1) <1 Exceptionally Low        Attention/Executive Function:            Trail Making Test (TMT): Raw Score Raw Score (T Score) Percentile     Part A DC'D @ 300,  6 errors DC'D @ 300,  2 errors --- Impaired    Part B Discontinued Discontinued --- Impaired         D-KEFS Color-Word Interference Test: Raw Score Raw Score (Scaled Score) Percentile     Color Naming 90 secs. Discontinued --- Impaired    Word Reading 90 secs. Discontinued --- Impaired    Inhibition Discontinued Discontinued --- Impaired    Inhibition/Switching Discontinued Discontinued --- Impaired        D-KEFS Verbal Fluency Test: Raw Score Raw Score (Scaled Score) Percentile     Letter Total Correct 6 3 (1) <1 Exceptionally Low    Category Total Correct 10 11 (1) <1 Exceptionally Low    Category Switching Total Correct 3 1 (1) <1 Exceptionally Low    Category Switching Accuracy 0 0 (1) <1 Exceptionally Low      Total Set Loss  Errors 4 4 (8) 25 Average      Total Repetition Errors 1 3 (10) 50 Average        D-KEFS 20 Questions Test: Scaled Score Scaled Score Percentile     Total Weighted Achievement Score --- Discontinued --- Impaired    Initial Abstraction Score --- --- --- ---        NAB Executive Functions Module, Form 1: T Score T Score Percentile     Judgment --- 22 <1 Exceptionally Low        Language:            Verbal Fluency Test: Raw Score Raw Score (T Score) Percentile     Phonemic Fluency (FAS) 6 3 (17) <1 Exceptionally Low    Animal Fluency 4 5 (14) <1 Exceptionally Low         NAB Language Module, Form 2: T Score T Score Percentile     Naming 16/31 13/31 (19) <1 Exceptionally Low        Visuospatial/Visuoconstruction:       Raw Score Raw Score Percentile   Clock Drawing: 2/10 2/10 --- Impaired         Scaled Score Scaled Score Percentile  WAIS-IV Matrix Reasoning: 5 1 <1 Exceptionally Low        Mood and Personality:       Raw Score Raw Score Percentile   Geriatric Depression Scale: 14 8 --- Within Normal Limits  Geriatric Anxiety Scale: 13 7 --- Minimal    Somatic 3 1 --- Minimal    Cognitive 8 3 --- Mild    Affective 2 3 --- Minimal        Additional Questionnaires:       Raw Score Raw Score Percentile   PROMIS Sleep Disturbance Questionnaire: 8 8 --- None to Slight   Informed Consent and Coding/Compliance:   Ms. Latona was provided with a verbal description of the nature and purpose of the present neuropsychological evaluation. Also reviewed were the foreseeable risks and/or discomforts and benefits of the procedure, limits of confidentiality, and mandatory reporting requirements of this provider. The patient was given the opportunity to ask questions and receive answers about the evaluation. Oral consent to participate was provided by the patient.   This evaluation was conducted by Newman Nickels, Ph.D., licensed clinical neuropsychologist. Mikayla Ross completed a comprehensive  clinical interview with Dr. Milbert Coulter, billed as one unit [phone], and 150 minutes of cognitive testing and scoring, billed as one unit [phone] and four additional units [id number]. Psychometrist Shan Levans, B.S., assisted Dr. Milbert Coulter with test administration and scoring procedures. As a separate and discrete service, Dr. Milbert Coulter spent a total of 160 minutes in interpretation and report writing billed as one unit [phone] and one unit [phone].

## 2020-08-02 ENCOUNTER — Ambulatory Visit: Payer: Medicare HMO | Admitting: Family Medicine

## 2020-08-06 ENCOUNTER — Other Ambulatory Visit: Payer: Self-pay | Admitting: Family Medicine

## 2020-08-06 DIAGNOSIS — F419 Anxiety disorder, unspecified: Secondary | ICD-10-CM

## 2020-08-06 DIAGNOSIS — F32A Depression, unspecified: Secondary | ICD-10-CM

## 2020-08-06 DIAGNOSIS — G3184 Mild cognitive impairment, so stated: Secondary | ICD-10-CM

## 2020-08-08 ENCOUNTER — Ambulatory Visit (INDEPENDENT_AMBULATORY_CARE_PROVIDER_SITE_OTHER): Payer: Medicare HMO | Admitting: Psychology

## 2020-08-08 ENCOUNTER — Other Ambulatory Visit: Payer: Self-pay

## 2020-08-08 DIAGNOSIS — F0390 Unspecified dementia without behavioral disturbance: Secondary | ICD-10-CM

## 2020-08-08 DIAGNOSIS — F0150 Vascular dementia without behavioral disturbance: Secondary | ICD-10-CM

## 2020-08-08 NOTE — Progress Notes (Signed)
   Neuropsychology Feedback Session [name]. [facility] Department of Neurology  Reason for Referral:   Mikayla Ross a 69 y.o. right-handed Caucasian female referred by Nita Sickle, D.O.,to characterize hercurrent cognitive functioning and assist with diagnostic clarity and treatment planning in the context of a prior diagnosis of a mild neurocognitive disorder (severe end of this spectrum) and concerns for continued cognitive decline.   Feedback:   [name] completed a comprehensive neuropsychological evaluation on 07/31/2020. Please refer to that encounter for the full report and recommendations. Briefly, results suggested diffuse cognitive impairment, often in the exceptionally low normative ranges. A relative strength was exhibited across basic attention. However, all other cognitive domains were exceptionally low relative to age-matched peers. Test results were largely stable relative to her previous evaluation. Importantly, this does not suggest the absence of cognitive or functional decline. Results during the previous evaluation were low to the extent that achieving measurable decline was very unlikely. Since her previous evaluation, [name] husband reported continued cognitive decline and evolving difficulties completing instrumental activities of daily living (ADLs), especially surrounding medication and financial management, as well as driving. This, coupled with evidence for significant cognitive dysfunction described above, suggests that she now meets criteria for a Major Neurocognitive Disorder (formerly "dementia").  [name] was accompanied by her husband during the current telephone call. They were within their residence while I was within my office. I discussed the limitations of evaluation and management by telemedicine and the availability of in person appointments. [name] and her husband expressed their understanding and agreed to proceed. Content of  the current session focused on the results of her neuropsychological evaluation. [name] and her husband were given the opportunity to ask questions and their questions were answered. They was encouraged to reach out should additional questions arise. A copy of her report was mailed at the conclusion of the previous visit.      Less than 16 minutes were spent conducting the current feedback session with [name].

## 2020-08-08 NOTE — Patient Instructions (Signed)
Recommendations: If not already done, I would strongly advise that [name] and her husband discuss her wishes regarding durable power of attorney and medical decision making so that she can have input into these choices. Additionally, they should discuss future plans for caretaking and seek out community options for in home/residential care should they become necessary. Should they require assistance in these areas, I would recommend that they contact Ward Givens at the [facility] [phone]) and schedule a consultation.   Should there be a continued progression of her current deficits over time, Mikayla Ross is unlikely to regain any independent living skills lost. Therefore, it is recommended that she remain as involved as possible in all aspects of household chores, finances, and medication management, with supervision to ensure adequate performance. She will likely benefit from the establishment and maintenance of a routine in order to maximize her functional abilities over time.  It will be important for Mikayla Ross to have another person with her when in situations where she may need to process information, weigh the pros and cons of different options, and make decisions, in order to ensure that she fully understands and recalls all information to be considered.  Mikayla Ross is encouraged to attend to lifestyle factors for brain health (e.g., regular physical exercise, good nutrition habits, regular participation in cognitively-stimulating activities, and general stress management techniques), which are likely to have benefits for both emotional adjustment and cognition. Optimal control of vascular risk factors (including safe cardiovascular exercise and adherence to dietary recommendations) is encouraged.   Information important to remember should be provided in written format in all instances. This should be placed in a highly visible and commonly frequented location in her  residence to help promote recall.   To address problems with processing speed, she may wish to consider:   -Ensuring that she is alerted when essential material or instructions are being presented   -Adjusting the speed at which new information is presented   -Allowing for more time in comprehending, processing, and responding in conversation  To address problems with fluctuating attention, she may wish to consider:   -Avoiding external distractions when needing to concentrate   -Limiting exposure to fast paced environments with multiple sensory demands   -Writing down complicated information and using checklists   -Attempting and completing one task at a time (i.e., no multi-tasking)   -Verbalizing aloud each step of a task to maintain focus   -Taking frequent breaks during the completion of steps/tasks to avoid fatigue   -Reducing the amount of information considered at one time

## 2020-08-09 ENCOUNTER — Ambulatory Visit (INDEPENDENT_AMBULATORY_CARE_PROVIDER_SITE_OTHER): Payer: Medicare HMO | Admitting: Neurology

## 2020-08-09 ENCOUNTER — Encounter: Payer: Self-pay | Admitting: Neurology

## 2020-08-09 VITALS — BP 98/89 | HR 74 | Ht 63.0 in | Wt 123.0 lb

## 2020-08-09 DIAGNOSIS — I6523 Occlusion and stenosis of bilateral carotid arteries: Secondary | ICD-10-CM

## 2020-08-09 DIAGNOSIS — F0150 Vascular dementia without behavioral disturbance: Secondary | ICD-10-CM

## 2020-08-09 DIAGNOSIS — G309 Alzheimer's disease, unspecified: Secondary | ICD-10-CM

## 2020-08-09 DIAGNOSIS — F0280 Dementia in other diseases classified elsewhere without behavioral disturbance: Secondary | ICD-10-CM | POA: Diagnosis not present

## 2020-08-09 DIAGNOSIS — R69 Illness, unspecified: Secondary | ICD-10-CM | POA: Diagnosis not present

## 2020-08-09 DIAGNOSIS — Z7189 Other specified counseling: Secondary | ICD-10-CM

## 2020-08-09 DIAGNOSIS — I639 Cerebral infarction, unspecified: Secondary | ICD-10-CM | POA: Diagnosis not present

## 2020-08-09 NOTE — Progress Notes (Signed)
Follow-up Visit   Date: 08/09/20   Mikayla Ross MRN: [id number] DOB: 1951-08-08   Interim History: Mikayla Ross is a 69 y.o. right-handed female with history of R Bell's palpsy, hyperlipidemia, OSA, and tobacco use returning to the clinic for follow-up of dementia and carotid stenosis.  She is accompanied by her husband.    History of present illness: She became the primary care giver to her 72 year-old father around 2018.  Since this time, she and her husband have noticed that she is more forgetful and often misplaces items.  She quickly forgets details of a conversation within minutes and often repeats herself.  She cannot complete tasks. She cooks microwave meals.  She does not use the stove or oven.  She does not manage finances.  She manages her father's medications, but tends to check herself multiple times.  Her mood is "terrible" due to stress related to caring for her 60 year-old father who is blind and bedridden.  She does not have any siblings.    She stopped driving 1 year ago because her husband did not feel she was safe.  She does not have any hobbies.  She enjoys spending time with her husband, but is not able to see him often as she is living with her father.  She endorses feeling depressed.  MRI brain showed chronic left cerebellar ischemic changes as well as chronic microvascular ischemic changes.  Patient denies having history of stroke or acute change in equilibrium, vision, or coordination.  She was started on aspirin 81 mg and has been compliant with this.    UPDATE 07/01/2020:  She is here for follow-up.  She has moved back home after her father (22 years old) passed away in 04-21-23 who she was the primary care giver. Husband has noticed that her memory has progressively been getting worse and she is increasingly forgetful.  She is very sedentary and does not perform any household chores.  Husband manages medications in a pillbox and then told her to take them, however she  may forget which days of the week she is taken medications.  She is able to bathe, dress, and feed herself.    UPDATE 08/09/2020:  She is here to discuss the results of her neuropsychological testing which was consistent with major neurocognitive disorder (dementia). She gets anxious and overwhelmed when she is unable to remember things.  Her husband manages all IADLs.  She is able to perform ADLs. There was one episode of getting lost when they went to the beach and fortunately, she was able to fine a life guard and notify them of her situation.  She does not have ID bracelet.  Her daughter unexpected died in the spring from an MI and father in 04/21/23 from advanced dementia, so this year has been difficult.    Medications:  Current Outpatient Medications on File Prior to Visit  Medication Sig Dispense Refill  . atorvastatin (LIPITOR) 20 MG tablet Take 1 tablet (20 mg total) by mouth daily. 90 tablet 3  . atorvastatin (LIPITOR) 40 MG tablet Take 1 tablet (40 mg total) by mouth daily. 90 tablet 0  . donepezil (ARICEPT) 10 MG tablet TAKE 1 TABLET BY MOUTH AT BEDTIME 30 tablet 5  . memantine (NAMENDA) 10 MG tablet Take 1 tablet (10 mg total) by mouth 2 (two) times daily. 60 tablet 3  . Multiple Vitamin (MULTIVITAMIN) tablet Take 1 tablet by mouth daily.    . sertraline (ZOLOFT) 50 MG  tablet TAKE 1/2 (ONE-HALF) TABLET BY MOUTH DAILY FOR FIRST 2 WEEKS, THEN TAKE 1 TABLET BY MOUTH DAILY 30 tablet 3   No current facility-administered medications on file prior to visit.    Allergies: No Known Allergies  Review of Systems:  CONSTITUTIONAL: No fevers, chills, night sweats, or weight loss.  EYES: No visual changes or eye pain ENT: No hearing changes.  No history of nose bleeds.   RESPIRATORY: No cough, wheezing and shortness of breath.   CARDIOVASCULAR: Negative for chest pain, and palpitations.   GI: Negative for abdominal discomfort, blood in stools or black stools.  No recent change in bowel habits.    GU:  No history of incontinence.   MUSCLOSKELETAL: No history of joint pain or swelling.  No myalgias.   SKIN: Negative for lesions, rash, and itching.   ENDOCRINE: Negative for cold or heat intolerance, polydipsia or goiter.   PSYCH:  + depression or anxiety symptoms.   NEURO: As Above.   Vital Signs:  BP 98/89   Pulse 74   Ht 5\' 3"  (1.6 m)   Wt 123 lb (55.8 kg)   SpO2 99%   BMI 21.79 kg/m    Neurological Exam: MENTAL STATUS including orientation to time, place -she does not know the year and says the recent holiday was Father's day. There is paucity of speech, she appears depressed and tearful when talking about her memory changes.  Speech is not dysarthric.  CRANIAL NERVES:   Pupils equal round and reactive to light.  Normal conjugate, extra-ocular eye movements in all directions of gaze.  No ptosis   MOTOR:  Motor strength is 5/5 in all extremities.    COORDINATION/GAIT:  Normal finger-to- nose-finger.  Intact rapid alternating movements bilaterally.  Gait narrow based and stable.   Data: MRI brain 07/12/2019: 1.  No acute intracranial abnormality. 2. Chronic ischemic disease in the left cerebellum. And chronic bilateral cerebral hemisphere small vessel ischemic disease Suspected.  Ultrasound carotid 06/09/2019: Right Carotid: Velocities in the right ICA are consistent with a 40-59% stenosis.                Non-hemodynamically significant plaque <50% noted in the CCA.                The ECA appears >50% stenosed.                The RICA velocities are elevated and stable compared to the prior exam.  Left Carotid: Velocities in the left ICA are consistent with a 40-59% stenosis, based on peak systolic velocities and plaque formation.                 Non-hemodynamically significant plaque noted in the CCA.               The LICA velocities are elevated and stable compared to the prior exam.  Vertebrals:  Right vertebral artery demonstrates antegrade flow. Small caliber  left vertebral artery with atypical, antegrade flow. Subclavians: Bilateral subclavian artery flow was disturbed.  *See table(s) above for measurements and observations. Suggest follow up study in 12 months.   Lab Results  Component Value Date   CHOL 191 05/03/2019   HDL 66.00 05/03/2019   LDLCALC 108 (H) 05/03/2019   TRIG 85.0 05/03/2019   CHOLHDL 3 05/03/2019   Neuropsychological testing 10/06/2019: Mild neurocognitive disorder to the severe end of the spectrum Neuropsychological testing 07/31/2020:  Major neurocognitive disorder  IMPRESSION/PLAN: 1.  Dementia, probable Alzheimer's.  Discussed  the diagnosis at length and management options.  - Continue Aricept 10mg  daily and Namenda 10mg  twice daily  - Recommend setting up POA and Advanced directives, information was provided   - Recommend getting ID bracelet  - Encouraged to stay active, as able, and avoid situations which would be overwhelming  - She has excellent support from her husband  2.  Bilateral carotid stenosis, moderate (40-59%)  - Monitor annually  3.  History of ischemic left cerebellar stroke, age uncertain, asymptomatic.  - Continue ASA 81mg   - Increase lipitor to 40mg  daily  - Patient not interested in tobacco cessation.    4.  I spent 20 minutes in face-to-face counseling re: Advance Care Planning including the discussion of the patient's goals of care and preferences, future treatment options and decision, and an explanation of advanced directives.  In addition to the patient, husband was present.  Goals of care discussion included maintaining quality of life, PEG/trach, home safety, and symptom management.    Patient and family had the opportunity to ask questions and I answered them to the best of my ability.  A copy of Advanced Directive forms was provided.    Return to clinic in 6 months   Thank you for allowing me to participate in patient's care.  If I can answer any additional questions, I would  be pleased to do so.    Sincerely,    Nyan Dufresne K. , DO

## 2020-08-09 NOTE — Patient Instructions (Addendum)
Please get an ID bracelet  Alzheimer's association helpline [phone]  Set up power of attorney and Advanced Directives  Start atorvastatin 40mg  daily  Return to clinic in 6 months

## 2020-08-12 ENCOUNTER — Ambulatory Visit: Payer: Medicare HMO | Admitting: Neurology

## 2020-08-20 DIAGNOSIS — H269 Unspecified cataract: Secondary | ICD-10-CM | POA: Diagnosis not present

## 2020-08-20 DIAGNOSIS — H52209 Unspecified astigmatism, unspecified eye: Secondary | ICD-10-CM | POA: Diagnosis not present

## 2020-08-20 DIAGNOSIS — H5203 Hypermetropia, bilateral: Secondary | ICD-10-CM | POA: Diagnosis not present

## 2020-08-20 DIAGNOSIS — H524 Presbyopia: Secondary | ICD-10-CM | POA: Diagnosis not present

## 2020-08-23 ENCOUNTER — Ambulatory Visit: Payer: Medicare HMO | Admitting: Neurology

## 2020-09-11 DIAGNOSIS — R69 Illness, unspecified: Secondary | ICD-10-CM | POA: Diagnosis not present

## 2020-09-20 ENCOUNTER — Ambulatory Visit: Payer: Medicare HMO | Admitting: Neurology

## 2020-10-14 ENCOUNTER — Other Ambulatory Visit: Payer: Self-pay | Admitting: Family Medicine

## 2020-10-14 DIAGNOSIS — G3184 Mild cognitive impairment, so stated: Secondary | ICD-10-CM

## 2020-10-16 ENCOUNTER — Other Ambulatory Visit: Payer: Self-pay | Admitting: Family Medicine

## 2020-10-16 DIAGNOSIS — G3184 Mild cognitive impairment, so stated: Secondary | ICD-10-CM

## 2020-10-31 ENCOUNTER — Other Ambulatory Visit: Payer: Self-pay | Admitting: Neurology

## 2020-12-07 ENCOUNTER — Other Ambulatory Visit: Payer: Self-pay | Admitting: Family Medicine

## 2020-12-07 DIAGNOSIS — F32A Depression, unspecified: Secondary | ICD-10-CM

## 2020-12-07 DIAGNOSIS — F419 Anxiety disorder, unspecified: Secondary | ICD-10-CM

## 2020-12-09 ENCOUNTER — Other Ambulatory Visit: Payer: Self-pay | Admitting: Family Medicine

## 2020-12-09 DIAGNOSIS — F32A Depression, unspecified: Secondary | ICD-10-CM

## 2020-12-09 DIAGNOSIS — F419 Anxiety disorder, unspecified: Secondary | ICD-10-CM

## 2020-12-27 ENCOUNTER — Encounter: Payer: Self-pay | Admitting: Family Medicine

## 2020-12-27 ENCOUNTER — Ambulatory Visit (INDEPENDENT_AMBULATORY_CARE_PROVIDER_SITE_OTHER): Payer: Medicare HMO | Admitting: Family Medicine

## 2020-12-27 ENCOUNTER — Other Ambulatory Visit: Payer: Self-pay

## 2020-12-27 VITALS — BP 122/64 | HR 74 | Resp 16 | Ht 63.0 in | Wt 138.0 lb

## 2020-12-27 DIAGNOSIS — R69 Illness, unspecified: Secondary | ICD-10-CM | POA: Diagnosis not present

## 2020-12-27 DIAGNOSIS — E785 Hyperlipidemia, unspecified: Secondary | ICD-10-CM | POA: Diagnosis not present

## 2020-12-27 DIAGNOSIS — F0281 Dementia in other diseases classified elsewhere with behavioral disturbance: Secondary | ICD-10-CM | POA: Diagnosis not present

## 2020-12-27 DIAGNOSIS — G309 Alzheimer's disease, unspecified: Secondary | ICD-10-CM

## 2020-12-27 LAB — LIPID PANEL
Cholesterol: 177 mg/dL (ref 0–200)
HDL: 70.1 mg/dL (ref >39.00)
LDL Cholesterol: 94 mg/dL (ref 0–99)
NonHDL: 107.13
Total CHOL/HDL Ratio: 3
Triglycerides: 65 mg/dL (ref 0.0–149.0)
VLDL: 13 mg/dL (ref 0.0–40.0)

## 2020-12-27 LAB — COMPREHENSIVE METABOLIC PANEL
ALT: 16 U/L (ref 0–35)
AST: 16 U/L (ref 0–37)
Albumin: 4.3 g/dL (ref 3.5–5.2)
Alkaline Phosphatase: 50 U/L (ref 39–117)
BUN: 15 mg/dL (ref 6–23)
CO2: 32 mEq/L (ref 19–32)
Calcium: 9.5 mg/dL (ref 8.4–10.5)
Chloride: 104 mEq/L (ref 96–112)
Creatinine, Ser: 0.77 mg/dL (ref 0.40–1.20)
GFR: 79.25 mL/min (ref >60.00)
Glucose, Bld: 83 mg/dL (ref 70–99)
Potassium: 4.2 mEq/L (ref 3.5–5.1)
Sodium: 141 mEq/L (ref 135–145)
Total Bilirubin: 0.6 mg/dL (ref 0.2–1.2)
Total Protein: 6.4 g/dL (ref 6.0–8.3)

## 2020-12-27 MED ORDER — ATORVASTATIN CALCIUM 40 MG PO TABS: 40.0000 mg | ORAL_TABLET | Freq: Every day | ORAL | 2 refills | Status: DC

## 2020-12-27 MED ORDER — DONEPEZIL HCL 10 MG PO TABS: 10.0000 mg | ORAL_TABLET | Freq: Every day | ORAL | 2 refills | Status: DC

## 2020-12-27 MED ORDER — SERTRALINE HCL 100 MG PO TABS: 100.0000 mg | ORAL_TABLET | Freq: Every day | ORAL | 3 refills | Status: DC

## 2020-12-27 MED ORDER — MEMANTINE HCL 10 MG PO TABS: 10.0000 mg | ORAL_TABLET | Freq: Two times a day (BID) | ORAL | 2 refills | Status: DC

## 2020-12-27 NOTE — Patient Instructions (Signed)
Keep the diet clean and stay active.  Give [place] 2-3 business days to get the results of your labs back.   Aim to do some physical exertion for 150 minutes per week. This is typically divided into 5 days per week, 30 minutes per day. The activity should be enough to get your heart rate up. Anything is better than nothing if you have time constraints.  Try to read and/or do a puzzle 60 minutes or more daily.   Take 2 tabs of the sertraline/Zoloft until you run out. I have sent in a new strength to start when convenient.   Let us know if you need anything.

## 2020-12-27 NOTE — Progress Notes (Signed)
Chief Complaint  Patient presents with  . Mild Neurocognitive Disorder    6 month follow up     Subjective: Hyperlipidemia Patient presents for Hyperlipidemia follow up. Here w spouse Dorene Sorrow. Currently taking Lipitor 40 mg/d and compliance with treatment thus far has been good. She denies myalgias. She is adhering to a healthy diet. Exercise: none, rare walking The patient is not known to have coexisting coronary artery disease.  Dementia Hx of dementia, worked up by Neuro. Taking Aricep 10 mg/d and Namenda 10 mg bid. No AE's, reports compliance per husband. Exercise as above. She still smokes. Does not read or do puzzles/mental games.   Past Medical History:  Diagnosis Date  . Bilateral carotid artery stenosis 01/05/2018  . Estrogen deficiency 02/14/2018  . High cholesterol   . Hyperlipidemia 10/06/2019  . Major neurocognitive disorder, unclear etiology 07/31/2020   possible Alzheimer's disease  . Obstructive sleep apnea   . Right-sided Bell's palsy 12/27/2017  . Stroke    Asymptomatic ischemic left cerebellar stroke    Objective: BP 122/64 (BP Location: Right Arm, Patient Position: Sitting, Cuff Size: Normal)   Pulse 74   Resp 16   Ht 5\' 3"  (1.6 m)   Wt 138 lb (62.6 kg)   SpO2 98%   BMI 24.45 kg/m  General: Awake, appears stated age HEENT: MMM Heart: RRR, no LE edema, no bruits Lungs: CTAB, no rales, wheezes or rhonchi. No accessory muscle use Neuro: Alert to person and place. Unsure of time and president.  Psych: Limited judgment and insight, normal affect and mood  Assessment and Plan: Hyperlipidemia, unspecified hyperlipidemia type - Plan: atorvastatin (LIPITOR) 40 MG tablet, Comprehensive metabolic panel, Lipid panel  Alzheimer's dementia with behavioral disturbance, unspecified timing of dementia onset (HCC) - Plan: memantine (NAMENDA) 10 MG tablet, donepezil (ARICEPT) 10 MG tablet, sertraline (ZOLOFT) 100 MG tablet  1. Cont Lipitor. Ck labs. Stay on asa given  hx of stroke. 2. Cont Namenda 10 mg bid, Aricept 10 mg/d. Counseled on exercise. Counseled on reading/doing puzzles. Will change Zoloft 50 mg to 100 mg for behavioral issues. Will consider Atyp antipsychotic if no better. F/u in 1 mo. The patient and her spouse voiced understanding and agreement to the plan.  [place], DO 12/27/20  8:56 AM

## 2020-12-30 ENCOUNTER — Ambulatory Visit: Payer: Medicare HMO | Admitting: Neurology

## 2020-12-30 ENCOUNTER — Other Ambulatory Visit: Payer: Self-pay

## 2020-12-30 ENCOUNTER — Encounter: Payer: Self-pay | Admitting: Neurology

## 2020-12-30 VITALS — BP 123/73 | HR 69 | Ht 63.0 in | Wt 139.0 lb

## 2020-12-30 DIAGNOSIS — G309 Alzheimer's disease, unspecified: Secondary | ICD-10-CM

## 2020-12-30 DIAGNOSIS — F0280 Dementia in other diseases classified elsewhere without behavioral disturbance: Secondary | ICD-10-CM | POA: Diagnosis not present

## 2020-12-30 DIAGNOSIS — R69 Illness, unspecified: Secondary | ICD-10-CM | POA: Diagnosis not present

## 2020-12-30 NOTE — Patient Instructions (Addendum)
Once the temperatures get warmer, start walking daily  Try to engage in activities you enjoy  Return to clinic in 6 months

## 2020-12-30 NOTE — Progress Notes (Signed)
Follow-up Visit   Date: 12/30/20   KATLEN SEYER MRN: [id number] DOB: Jun 06, 1951   Interim History: Mikayla Ross is a 70 y.o. right-handed female with history of R Bell's palpsy, hyperlipidemia, OSA, and tobacco use returning to the clinic for follow-up of dementia and carotid stenosis.  She is accompanied by her husband.   UPDATE 12/30/2020: She is here for follow-up visit. Husband has noticed progressive worsening in memory.  She is repeating herself much more, she has more mood swings for which her Zoloft was increased to 100mg /d.  Husband tried to encourage her to do puzzles, books, but she does not have any motivation or interest.  She is very sedentary and spends all day watching TV.  No new complaints.   Medications:  Current Outpatient Medications on File Prior to Visit  Medication Sig Dispense Refill  . atorvastatin (LIPITOR) 40 MG tablet Take 1 tablet (40 mg total) by mouth daily. 90 tablet 2  . donepezil (ARICEPT) 10 MG tablet Take 1 tablet (10 mg total) by mouth at bedtime. 90 tablet 2  . memantine (NAMENDA) 10 MG tablet Take 1 tablet (10 mg total) by mouth 2 (two) times daily. 180 tablet 2  . Multiple Vitamin (MULTIVITAMIN) tablet Take 1 tablet by mouth daily.    . sertraline (ZOLOFT) 100 MG tablet Take 1 tablet (100 mg total) by mouth daily. 30 tablet 3   No current facility-administered medications on file prior to visit.    Allergies: No Known Allergies  Vital Signs:  BP 123/73   Pulse 69   Ht 5\' 3"  (1.6 m)   Wt 139 lb (63 kg)   SpO2 98%   BMI 24.62 kg/m    Neurological Exam: MENTAL STATUS including orientation to time, place.  Answers questions appropriately, but hesitates at times.  She need prompting with simple commands, such as asking her to walk in the room  CRANIAL NERVES:    Normal conjugate, extra-ocular eye movements in all directions of gaze.  No ptosis   MOTOR:  Motor strength is 5/5 in all extremities.    COORDINATION/GAIT:  Normal  finger-to- nose-finger.  Intact rapid alternating movements bilaterally.  Gait narrow based and stable.   Data: MRI brain 07/12/2019: 1.  No acute intracranial abnormality. 2. Chronic ischemic disease in the left cerebellum. And chronic bilateral cerebral hemisphere small vessel ischemic disease Suspected.  Ultrasound carotid 06/09/2019: Right Carotid: Velocities in the right ICA are consistent with a 40-59% stenosis.                Non-hemodynamically significant plaque <50% noted in the CCA.                The ECA appears >50% stenosed.                The RICA velocities are elevated and stable compared to the prior exam.  Left Carotid: Velocities in the left ICA are consistent with a 40-59% stenosis, based on peak systolic velocities and plaque formation.                 Non-hemodynamically significant plaque noted in the CCA.               The LICA velocities are elevated and stable compared to the prior exam.  Vertebrals:  Right vertebral artery demonstrates antegrade flow. Small caliber left vertebral artery with atypical, antegrade flow. Subclavians: Bilateral subclavian artery flow was disturbed.  *See table(s) above for measurements and observations.  Suggest follow up study in 12 months.   Lab Results  Component Value Date   CHOL 177 12/27/2020   HDL 70.10 12/27/2020   LDLCALC 94 12/27/2020   TRIG 65.0 12/27/2020   CHOLHDL 3 12/27/2020   Neuropsychological testing 10/06/2019: Mild neurocognitive disorder to the severe end of the spectrum Neuropsychological testing 07/31/2020:  Major neurocognitive disorder  IMPRESSION/PLAN: 1.  Dementia, probable Alzheimer's.  Mild progression as expected with the disease process. She is still able to perform ADLs, completely dependent on IADLs.  There is overlapping depression which makes it even more difficulty to try to motivate patient to stay active and engage in stimulating activities.  Her PCP has recently increased Zoloft to  100mg /d, hopefully this will help.  - Continue Aricept 10mg  and Namenda 10mg  BID   - Recommend starting exercise program, such as walking  - She enjoys gardening and encouraged this, especially once temperatures are more comfortable  2.  Bilateral carotid stenosis, moderate (40-59%)  - stable in 05/2020, recheck annually  3.  History of ischemic left cerebellar stroke, age uncertain, asymptomatic.  - Continue ASA 81mg   - Continue lipitor to 40mg  daily  - Patient not interested in tobacco cessation.    Total time spent reviewing records, interview, history/exam, documentation, and coordination of care on day of encounter:  20 min      Return to clinic in 6 months   Thank you for allowing me to participate in patient's care.  If I can answer any additional questions, I would be pleased to do so.    Sincerely,    Daishaun Ayre K. , DO

## 2021-01-27 ENCOUNTER — Other Ambulatory Visit: Payer: Self-pay

## 2021-01-27 ENCOUNTER — Ambulatory Visit (INDEPENDENT_AMBULATORY_CARE_PROVIDER_SITE_OTHER): Payer: Medicare HMO | Admitting: Family Medicine

## 2021-01-27 ENCOUNTER — Encounter: Payer: Self-pay | Admitting: Family Medicine

## 2021-01-27 VITALS — BP 122/72 | HR 79 | Temp 98.0°F | Ht 63.0 in | Wt 139.0 lb

## 2021-01-27 DIAGNOSIS — F0281 Dementia in other diseases classified elsewhere with behavioral disturbance: Secondary | ICD-10-CM | POA: Diagnosis not present

## 2021-01-27 DIAGNOSIS — R69 Illness, unspecified: Secondary | ICD-10-CM | POA: Diagnosis not present

## 2021-01-27 DIAGNOSIS — G309 Alzheimer's disease, unspecified: Secondary | ICD-10-CM

## 2021-01-27 MED ORDER — SERTRALINE HCL 50 MG PO TABS: 50.0000 mg | ORAL_TABLET | Freq: Every day | ORAL | 2 refills | Status: DC

## 2021-01-27 MED ORDER — QUETIAPINE FUMARATE 25 MG PO TABS: 25.0000 mg | ORAL_TABLET | Freq: Every day | ORAL | 2 refills | Status: DC

## 2021-01-27 NOTE — Progress Notes (Signed)
Chief Complaint  Patient presents with  . Follow-up    Subjective: Patient is a 70 y.o. female here for follow-up.  She is here with her husband Mikayla Ross.  Patient is following up for dementia with behavioral disturbances.  She is compliant with Namenda 10 mg twice daily and Aricept 10 mg daily.  Because she is having more mood swings, her Zoloft was increased from 50 mg daily to 100 mg daily.  This was not particularly helpful.  She reports compliance and no adverse effects.  No homicidal or suicidal ideation.  She is still smoking.  She is not doing any exercising, reading, doing any puzzles or anything else mentally stimulating.  Past Medical History:  Diagnosis Date  . Bilateral carotid artery stenosis 01/05/2018  . Estrogen deficiency 02/14/2018  . High cholesterol   . Hyperlipidemia 10/06/2019  . Major neurocognitive disorder, unclear etiology 07/31/2020   possible Alzheimer's disease  . Obstructive sleep apnea   . Right-sided Bell's palsy 12/27/2017  . Stroke    Asymptomatic ischemic left cerebellar stroke    Objective: BP 122/72 (BP Location: Left Arm, Patient Position: Sitting, Cuff Size: Normal)   Pulse 79   Temp 98 F (36.7 C) (Oral)   Ht 5\' 3"  (1.6 m)   Wt 139 lb (63 kg)   SpO2 97%   BMI 24.62 kg/m  General: Awake, appears stated age Lungs: No accessory muscle use Psych: Limited judgment and insight, normal affect and mood  Assessment and Plan: Alzheimer's dementia with behavioral disturbance, unspecified timing of dementia onset (HCC) - Plan: sertraline (ZOLOFT) 50 MG tablet, QUEtiapine (SEROQUEL) 25 MG tablet, Ambulatory referral to Psychiatry  Status: Chronic, uncontrolled.  Go back to 50 mg daily of sertraline.  Start quetiapine 25 mg nightly.  Refer to psychiatry.  I did give them contact psychiatry information.  Stop smoking, counseled on exercise, counseled on doing mentally stimulating tasks. I will see her in 1 month to recheck the above.  Would consider  increasing dosage versus trying Abilify. The patient and her spouse voiced understanding and agreement to the plan.  [place], DO 01/27/21  7:18 AM

## 2021-01-27 NOTE — Patient Instructions (Signed)
Aim to do some physical exertion for 150 minutes per week. This is typically divided into 5 days per week, 30 minutes per day. The activity should be enough to get your heart rate up. Anything is better than nothing if you have time constraints.  Stay hydrated.  If you do not hear anything about your referral in the next 1-2 weeks, call our office and ask for an update.  Do mentally stimulating things like puzzles, reading, and games.  [facility] [address] [place], [place] [address] [phone]  [facility] [address] [place], [place] [address] [phone]  [facility] [address] [place], [place] [address] [phone]  [facility] [address], [place], [place], #[phone] [address], [place], [place], [phone]  [facility] [address] [place], [place] [address] [phone]  [facility] [address] [place], [place] [phone]  [facility] [address] [place], [place] [phone]  Call one of these offices sooner than later as it can take 2-3 months to get a new patient appointment.

## 2021-02-07 ENCOUNTER — Ambulatory Visit: Payer: Medicare HMO | Admitting: Neurology

## 2021-02-07 ENCOUNTER — Other Ambulatory Visit: Payer: Self-pay | Admitting: Neurology

## 2021-02-07 DIAGNOSIS — E785 Hyperlipidemia, unspecified: Secondary | ICD-10-CM

## 2021-02-10 ENCOUNTER — Other Ambulatory Visit: Payer: Self-pay | Admitting: Neurology

## 2021-02-10 DIAGNOSIS — E785 Hyperlipidemia, unspecified: Secondary | ICD-10-CM

## 2021-02-15 ENCOUNTER — Other Ambulatory Visit: Payer: Self-pay | Admitting: Neurology

## 2021-02-15 DIAGNOSIS — E785 Hyperlipidemia, unspecified: Secondary | ICD-10-CM

## 2021-02-19 ENCOUNTER — Other Ambulatory Visit: Payer: Self-pay | Admitting: Neurology

## 2021-02-19 DIAGNOSIS — E785 Hyperlipidemia, unspecified: Secondary | ICD-10-CM

## 2021-02-20 ENCOUNTER — Other Ambulatory Visit: Payer: Self-pay | Admitting: Neurology

## 2021-02-20 DIAGNOSIS — E785 Hyperlipidemia, unspecified: Secondary | ICD-10-CM

## 2021-02-24 ENCOUNTER — Encounter: Payer: Self-pay | Admitting: Family Medicine

## 2021-02-24 ENCOUNTER — Other Ambulatory Visit: Payer: Self-pay

## 2021-02-24 ENCOUNTER — Ambulatory Visit (INDEPENDENT_AMBULATORY_CARE_PROVIDER_SITE_OTHER): Payer: Medicare HMO | Admitting: Family Medicine

## 2021-02-24 VITALS — BP 120/78 | HR 79 | Temp 98.1°F | Ht 63.0 in | Wt 142.0 lb

## 2021-02-24 DIAGNOSIS — F0281 Dementia in other diseases classified elsewhere with behavioral disturbance: Secondary | ICD-10-CM

## 2021-02-24 DIAGNOSIS — S0100XA Unspecified open wound of scalp, initial encounter: Secondary | ICD-10-CM | POA: Diagnosis not present

## 2021-02-24 DIAGNOSIS — G309 Alzheimer's disease, unspecified: Secondary | ICD-10-CM | POA: Diagnosis not present

## 2021-02-24 DIAGNOSIS — R69 Illness, unspecified: Secondary | ICD-10-CM | POA: Diagnosis not present

## 2021-02-24 DIAGNOSIS — L729 Follicular cyst of the skin and subcutaneous tissue, unspecified: Secondary | ICD-10-CM

## 2021-02-24 MED ORDER — QUETIAPINE FUMARATE 50 MG PO TABS: 50.0000 mg | ORAL_TABLET | Freq: Every day | ORAL | 2 refills | Status: DC

## 2021-02-24 NOTE — Patient Instructions (Addendum)
Take 2 tabs of the Seroquel/quetiapine until you run out. A new dosage has been sent in.   Keep the diet clean and stay active.  Very strong work with your smoking cessation.   Keep reading!  Aim to do some physical exertion for 150 minutes per week. This is typically divided into 5 days per week, 30 minutes per day. The activity should be enough to get your heart rate up. Anything is better than nothing if you have time constraints.   Put triple antibiotic ointment on the wound on your head twice daily.  If things become more painful, please let me know.   Let us know if you need anything.

## 2021-02-24 NOTE — Progress Notes (Signed)
Chief Complaint  Patient presents with  . Follow-up    Subjective: Patient is a 70 y.o. female here for f/u. Here w spouse Dorene Sorrow who helps provide hx.   Started back on Zoloft 50 mg/d and Seroquel 25 mg/d was initiated. Reports compliance, no AE's. She quit smoking, is starting to read.  Doing about 50% better w mood swings.   2 mo noticed a lump on the back of her neck. No pain, slight itching but she picks at it. No fevers or drainage. No new topicals.   Past Medical History:  Diagnosis Date  . Bilateral carotid artery stenosis 01/05/2018  . Estrogen deficiency 02/14/2018  . High cholesterol   . Hyperlipidemia 10/06/2019  . Major neurocognitive disorder, unclear etiology 07/31/2020   possible Alzheimer's disease  . Obstructive sleep apnea   . Right-sided Bell's palsy 12/27/2017  . Stroke    Asymptomatic ischemic left cerebellar stroke    Objective: BP 120/78 (BP Location: Left Arm, Patient Position: Sitting, Cuff Size: Normal)   Pulse 79   Temp 98.1 F (36.7 C) (Oral)   Ht 5\' 3"  (1.6 m)   Wt 142 lb (64.4 kg)   SpO2 97%   BMI 25.15 kg/m  General: Awake, appears stated age Skin: there is a raised and dome shaped, flesh colored lesion on the R posterior neck; measures approx 3.5 cm in diameter, no fluctuance, moveable with smooth borders; above and medial to this, there is a open wound with purplish and scaled surrounding tissue; nothing is able to be expressed, no redness, fluctuance, induration, ttp; measures 0.8 cm in diameter Heart: RRR Lungs: CTAB, no rales, wheezes or rhonchi. No accessory muscle use Psych: limited judgment and insight, normal affect and mood  Assessment and Plan: Alzheimer's dementia with behavioral disturbance, unspecified timing of dementia onset (HCC) - Plan: QUEtiapine (SEROQUEL) 50 MG tablet  Open wound of scalp, unspecified open wound type, initial encounter  Cyst of skin  1. Increase dose of Seroquel from 25 mg/d to 50 mg/d. Cont reading and  commended on smoking cessation.  2. TAO bid, keep clean and dry. Alert me if changes for worse.  3. Reassurance, can try to open up and remove or refer if it gets bothersome. Try not to prod/poke.  The patient and spouse voiced understanding and agreement to the plan.  [place], DO 02/24/21  7:58 AM

## 2021-03-28 ENCOUNTER — Ambulatory Visit (INDEPENDENT_AMBULATORY_CARE_PROVIDER_SITE_OTHER): Payer: Medicare HMO | Admitting: Family Medicine

## 2021-03-28 ENCOUNTER — Encounter: Payer: Self-pay | Admitting: Family Medicine

## 2021-03-28 ENCOUNTER — Other Ambulatory Visit: Payer: Self-pay

## 2021-03-28 VITALS — BP 132/78 | HR 68 | Temp 98.1°F | Ht 63.0 in | Wt 142.4 lb

## 2021-03-28 DIAGNOSIS — F0281 Dementia in other diseases classified elsewhere with behavioral disturbance: Secondary | ICD-10-CM

## 2021-03-28 DIAGNOSIS — G309 Alzheimer's disease, unspecified: Secondary | ICD-10-CM | POA: Diagnosis not present

## 2021-03-28 DIAGNOSIS — R69 Illness, unspecified: Secondary | ICD-10-CM | POA: Diagnosis not present

## 2021-03-28 MED ORDER — BUPROPION HCL ER (XL) 150 MG PO TB24: 150.0000 mg | ORAL_TABLET | Freq: Every day | ORAL | 2 refills | Status: DC

## 2021-03-28 MED ORDER — QUETIAPINE FUMARATE 25 MG PO TABS: 25.0000 mg | ORAL_TABLET | Freq: Every day | ORAL | 2 refills | Status: DC

## 2021-03-28 NOTE — Patient Instructions (Addendum)
Take 1/2 tab of Seroquel until you run out. A new dosage of 25 mg daily is sent in that you won't have to cut in half.  Let me know if there are cost issues.  Stay on everything else.  Keep the diet clean and stay active.  Keep reading and challenging your mind.   Let me know if you change your mind about seeing a psychiatrist.   Let us know if you need anything.

## 2021-03-28 NOTE — Progress Notes (Signed)
Chief Complaint  Patient presents with  . Follow-up    Subjective: Patient is a 70 y.o. female here for f/u dementia w behavioral disturbances. Here w spouse Dorene Sorrow who helps provide hx.   Seroquel was rx'd for helping with mood and aggression. This helped but was increased from 25 mg/d to 50 mg/d since last visit. No AE's, reports compliance. Not much change in mood. Does not want to see psych but has been given resources. Saw Neuro, compliant with Namenda and Aricept. Seems to lose focus. She is not exercising, she is reading.   Past Medical History:  Diagnosis Date  . Bilateral carotid artery stenosis 01/05/2018  . Estrogen deficiency 02/14/2018  . High cholesterol   . Hyperlipidemia 10/06/2019  . Major neurocognitive disorder, unclear etiology 07/31/2020   possible Alzheimer's disease  . Obstructive sleep apnea   . Right-sided Bell's palsy 12/27/2017  . Stroke    Asymptomatic ischemic left cerebellar stroke    Objective: BP 132/78 (BP Location: Left Arm, Patient Position: Sitting, Cuff Size: Normal)   Pulse 68   Temp 98.1 F (36.7 C) (Oral)   Ht 5\' 3"  (1.6 m)   Wt 142 lb 6 oz (64.6 kg)   SpO2 97%   BMI 25.22 kg/m  General: Awake, appears stated age Lungs: No accessory muscle use Psych:limited judgment and insight, normal affect and mood  Assessment and Plan: Alzheimer's dementia with behavioral disturbance, unspecified timing of dementia onset (HCC) - Plan: QUEtiapine (SEROQUEL) 25 MG tablet, buPROPion (WELLBUTRIN XL) 150 MG 24 hr tablet  Status: chronic, uncontrolled. Cont Aricept 10 qhs and Namenda 10 mg bid. Reduce Seroquel to 25 mg/d. Cont Zoloft 50 mg/d. Add Wellbutrin XL 150 mg/d. F/u in 1 mo. Stay active. Cont reading.  The patient and her spouse voiced understanding and agreement to the plan.  [place], DO 03/28/21  7:59 AM

## 2021-04-30 ENCOUNTER — Ambulatory Visit: Payer: Medicare HMO | Admitting: Family Medicine

## 2021-05-26 ENCOUNTER — Other Ambulatory Visit: Payer: Self-pay | Admitting: Family Medicine

## 2021-05-26 DIAGNOSIS — F0281 Dementia in other diseases classified elsewhere with behavioral disturbance: Secondary | ICD-10-CM

## 2021-05-26 MED ORDER — BUPROPION HCL ER (XL) 150 MG PO TB24: 150.0000 mg | ORAL_TABLET | Freq: Every day | ORAL | 6 refills | Status: DC

## 2021-06-21 DIAGNOSIS — Z6826 Body mass index (BMI) 26.0-26.9, adult: Secondary | ICD-10-CM | POA: Diagnosis not present

## 2021-06-21 DIAGNOSIS — Z7689 Persons encountering health services in other specified circumstances: Secondary | ICD-10-CM | POA: Diagnosis not present

## 2021-06-30 ENCOUNTER — Other Ambulatory Visit: Payer: Self-pay

## 2021-06-30 ENCOUNTER — Encounter: Payer: Self-pay | Admitting: Neurology

## 2021-06-30 ENCOUNTER — Ambulatory Visit: Payer: Medicare HMO | Admitting: Neurology

## 2021-06-30 VITALS — BP 138/74 | HR 71 | Ht 63.0 in | Wt 150.0 lb

## 2021-06-30 DIAGNOSIS — F0280 Dementia in other diseases classified elsewhere without behavioral disturbance: Secondary | ICD-10-CM

## 2021-06-30 DIAGNOSIS — R69 Illness, unspecified: Secondary | ICD-10-CM | POA: Diagnosis not present

## 2021-06-30 DIAGNOSIS — G309 Alzheimer's disease, unspecified: Secondary | ICD-10-CM | POA: Diagnosis not present

## 2021-06-30 NOTE — Patient Instructions (Addendum)
Recommend that you get a ID band and starting researching memory care centers  Home health referral  Return to clinic 6 months

## 2021-06-30 NOTE — Progress Notes (Signed)
Follow-up Visit   Date: 06/30/21   Mikayla Ross MRN: [id number] DOB: May 22, 1951   Interim History: Mikayla Ross is a 70 y.o. right-handed female with history of R Bell's palpsy, hyperlipidemia, OSA, and former smoker returning to the clinic for follow-up of dementia and carotid stenosis.  She is accompanied by her husband.  UPDATE 12/30/2020: She is here for follow-up visit. Husband has noticed progressive worsening in memory.  She is repeating herself much more, she has more mood swings for which her Zoloft was increased to 100mg /d.  Husband tried to encourage her to do puzzles, books, but she does not have any motivation or interest.  She is very sedentary and spends all day watching TV.  No new complaints.   UPDATE 06/30/2021:  Husband has noticed gradual worsening of independent functioning. She is unable to dress or undress herself. She will try to put her day clothes on top of her pajamas.  She is able to feed herself.  She is also having mood changes.  She spends her day eating, sleeping, or watching TV.  She quit smoking in April.  [place] after wandering.    Medications:  Current Outpatient Medications on File Prior to Visit  Medication Sig Dispense Refill   atorvastatin (LIPITOR) 40 MG tablet Take 1 tablet (40 mg total) by mouth daily. 90 tablet 2   buPROPion (WELLBUTRIN XL) 150 MG 24 hr tablet Take 1 tablet (150 mg total) by mouth daily. 30 tablet 6   donepezil (ARICEPT) 10 MG tablet Take 1 tablet (10 mg total) by mouth at bedtime. 90 tablet 2   memantine (NAMENDA) 10 MG tablet Take 1 tablet (10 mg total) by mouth 2 (two) times daily. 180 tablet 2   Multiple Vitamin (MULTIVITAMIN) tablet Take 1 tablet by mouth daily.     QUEtiapine (SEROQUEL) 25 MG tablet Take 1 tablet (25 mg total) by mouth at bedtime. 90 tablet 2   sertraline (ZOLOFT) 50 MG tablet Take 1 tablet (50 mg total) by mouth daily. 90 tablet 2   No current  facility-administered medications on file prior to visit.    Allergies: No Known Allergies  Vital Signs:  BP 138/74   Pulse 71   Ht 5\' 3"  (1.6 m)   Wt 150 lb (68 kg)   SpO2 98%   BMI 26.57 kg/m    Neurological Exam: MENTAL STATUS including orientation to self.  Does not know the year, home address, or home telephone number.  Unable to follow simple commands, such as showing left thumb or two fingers to nthe right hand. She walks over to my computer and places her thumb on my laptop.  CRANIAL NERVES:    Normal conjugate, extra-ocular eye movements in all directions of gaze.  No ptosis   MOTOR:  Motor strength is 5/5 in all extremities.    COORDINATION/GAIT:  Ideomotor apraxia. Gait narrow based and stable.   Data: MRI brain 07/12/2019: 1.  No acute intracranial abnormality. 2. Chronic ischemic disease in the left cerebellum. And chronic bilateral cerebral hemisphere small vessel ischemic disease Suspected.  Ultrasound carotid 06/09/2019: Right Carotid: Velocities in the right ICA are consistent with a 40-59% stenosis.                Non-hemodynamically significant plaque <50% noted in the CCA.                The ECA appears >50% stenosed.  The RICA velocities are elevated and stable compared to the prior exam.   Left Carotid: Velocities in the left ICA are consistent with a 40-59% stenosis, based on peak systolic velocities and plaque formation.                 Non-hemodynamically significant plaque noted in the CCA.               The LICA velocities are elevated and stable compared to the prior exam.   Vertebrals:  Right vertebral artery demonstrates antegrade flow. Small caliber left vertebral artery with atypical, antegrade flow. Subclavians: Bilateral subclavian artery flow was disturbed.   *See table(s) above for measurements and observations. Suggest follow up study in 12 months.     Lab Results  Component Value Date   CHOL 177 12/27/2020   HDL 70.10  12/27/2020   LDLCALC 94 12/27/2020   TRIG 65.0 12/27/2020   CHOLHDL 3 12/27/2020   Neuropsychological testing 10/06/2019: Mild neurocognitive disorder to the severe end of the spectrum Neuropsychological testing 07/31/2020:  Major neurocognitive disorder  IMPRESSION/PLAN: 1.  Alzheimer's Dementia, progressive cognitive decline, now requiring assistance with all ADLS except for feeding.  She is also starting to wander and needs 24-hr supervision and care.  Husband is the primary caregiver currently.  I recommend they need inhome care or consider memory care unit.  -  Continue Aricept 10mg  and Namenda 10mg  BID   -  Home health referral for home health aide and social work to assist with dementia care resources  -  Home safety issues addressed  -  Recommend getting ID band  2.  Bilateral carotid stenosis, moderate (40-59%)  - stable in 05/2020 - mutually decided to defer due to progressive dementia  3.  History of ischemic left cerebellar stroke, age uncertain, asymptomatic.  - Continue ASA 81mg   - Continue lipitor to 40mg  daily  - Praised for tobacco cessation  Return to clinic in 6 months  Total time spent reviewing records, interview, history/exam, documentation, and coordination of care on day of encounter:  35 min  Thank you for allowing me to participate in patient's care.  If I can answer any additional questions, I would be pleased to do so.    Sincerely,    Tiona Ruane K. 06/2020, DO

## 2021-07-22 ENCOUNTER — Other Ambulatory Visit: Payer: Self-pay | Admitting: Family Medicine

## 2021-07-22 DIAGNOSIS — G309 Alzheimer's disease, unspecified: Secondary | ICD-10-CM

## 2021-07-22 DIAGNOSIS — F0281 Dementia in other diseases classified elsewhere with behavioral disturbance: Secondary | ICD-10-CM

## 2021-08-11 ENCOUNTER — Telehealth: Payer: Self-pay | Admitting: Family Medicine

## 2021-08-11 NOTE — Telephone Encounter (Signed)
Left message for patient to call back and schedule Medicare Annual Wellness Visit (AWV) in office.  ° °If not able to come in office, please offer to do virtually or by telephone.  Left office number and my jabber #[phone]. ° °Due for AWVI ° °Please schedule at anytime with Nurse Health Advisor. °  °

## 2021-08-28 ENCOUNTER — Other Ambulatory Visit: Payer: Self-pay | Admitting: Family Medicine

## 2021-08-28 DIAGNOSIS — E785 Hyperlipidemia, unspecified: Secondary | ICD-10-CM

## 2021-08-28 MED ORDER — ATORVASTATIN CALCIUM 40 MG PO TABS: 40.0000 mg | ORAL_TABLET | Freq: Every day | ORAL | 2 refills | Status: DC

## 2021-10-18 ENCOUNTER — Other Ambulatory Visit: Payer: Self-pay | Admitting: Family Medicine

## 2021-10-18 DIAGNOSIS — G309 Alzheimer's disease, unspecified: Secondary | ICD-10-CM

## 2021-10-20 ENCOUNTER — Other Ambulatory Visit: Payer: Self-pay | Admitting: Family Medicine

## 2021-10-20 ENCOUNTER — Other Ambulatory Visit: Payer: Self-pay | Admitting: Neurology

## 2021-10-20 DIAGNOSIS — F02818 Dementia in other diseases classified elsewhere, unspecified severity, with other behavioral disturbance: Secondary | ICD-10-CM

## 2021-10-20 DIAGNOSIS — G309 Alzheimer's disease, unspecified: Secondary | ICD-10-CM

## 2021-10-21 ENCOUNTER — Other Ambulatory Visit: Payer: Self-pay | Admitting: Family Medicine

## 2021-10-21 DIAGNOSIS — F02818 Dementia in other diseases classified elsewhere, unspecified severity, with other behavioral disturbance: Secondary | ICD-10-CM

## 2021-10-21 MED ORDER — MEMANTINE HCL 10 MG PO TABS: 10.0000 mg | ORAL_TABLET | Freq: Two times a day (BID) | ORAL | 2 refills | Status: DC

## 2021-10-27 ENCOUNTER — Telehealth: Payer: Self-pay | Admitting: Family Medicine

## 2021-10-27 NOTE — Telephone Encounter (Signed)
Left message for patient to call back and schedule Medicare Annual Wellness Visit (AWV) in office.  ° °If not able to come in office, please offer to do virtually or by telephone.  Left office number and my jabber #[phone]. ° °Due for AWVI ° °Please schedule at anytime with Nurse Health Advisor. °  °

## 2021-11-15 ENCOUNTER — Other Ambulatory Visit: Payer: Self-pay | Admitting: Family Medicine

## 2021-11-15 DIAGNOSIS — G309 Alzheimer's disease, unspecified: Secondary | ICD-10-CM

## 2022-01-02 ENCOUNTER — Other Ambulatory Visit: Payer: Self-pay

## 2022-01-02 ENCOUNTER — Encounter: Payer: Self-pay | Admitting: Neurology

## 2022-01-02 ENCOUNTER — Ambulatory Visit: Payer: Medicare HMO | Admitting: Neurology

## 2022-01-02 DIAGNOSIS — G309 Alzheimer's disease, unspecified: Secondary | ICD-10-CM

## 2022-01-02 DIAGNOSIS — F02818 Dementia in other diseases classified elsewhere, unspecified severity, with other behavioral disturbance: Secondary | ICD-10-CM

## 2022-01-02 DIAGNOSIS — R69 Illness, unspecified: Secondary | ICD-10-CM | POA: Diagnosis not present

## 2022-01-02 MED ORDER — DONEPEZIL HCL 10 MG PO TABS: 20.0000 mg | ORAL_TABLET | Freq: Every day | ORAL | 3 refills | Status: DC

## 2022-01-02 NOTE — Progress Notes (Signed)
Follow-up Visit   Date: 01/02/22   Mikayla Ross MRN: [id number] DOB: 11-12-1951   Interim History: Mikayla Ross is a 71 y.o. right-handed female with history of R Bell's palpsy, hyperlipidemia, OSA, and former smoker returning to the clinic for follow-up of dementia and carotid stenosis.  She is accompanied by her husband.  UPDATE 12/30/2020: She is here for follow-up visit. Husband has noticed progressive worsening in memory.  She is repeating herself much more, she has more mood swings for which her Zoloft was increased to 100mg /d.  Husband tried to encourage her to do puzzles, books, but she does not have any motivation or interest.  She is very sedentary and spends all day watching TV.  No new complaints.   UPDATE 01/02/2022:  She is here for follow-up visit.  Since her last visit, husband reports progressive decline in independent functioning.  She requires assistance for dressing and undressing herself, as well as bathing.  He says she can go into the shower wearing all of her clothes.  She is unable to use basic appliances such as a telephone.  She does not have any hobbies and spends most of the day watching TV.  Mood and sleep is okay.  She takes Wellbutrin 150 mg daily.  She is also on Aricept 10 mg and Namenda 10 mg twice daily.  I spent is having greater difficulty trying to keep up with household duties because of the amount of time given to patient needs.  Medications:  Current Outpatient Medications on File Prior to Visit  Medication Sig Dispense Refill   atorvastatin (LIPITOR) 40 MG tablet Take 1 tablet (40 mg total) by mouth daily. 90 tablet 2   buPROPion (WELLBUTRIN XL) 150 MG 24 hr tablet Take 1 tablet (150 mg total) by mouth daily. 30 tablet 6   donepezil (ARICEPT) 10 MG tablet TAKE 1 TABLET BY MOUTH AT BEDTIME 90 tablet 0   memantine (NAMENDA) 10 MG tablet Take 1 tablet (10 mg total) by mouth 2 (two) times daily. 180 tablet 2   Multiple Vitamin (MULTIVITAMIN) tablet  Take 1 tablet by mouth daily.     QUEtiapine (SEROQUEL) 25 MG tablet TAKE 1 TABLET BY MOUTH AT BEDTIME 90 tablet 2   sertraline (ZOLOFT) 50 MG tablet Take 1 tablet by mouth once daily 90 tablet 0   No current facility-administered medications on file prior to visit.    Allergies: No Known Allergies  Vital Signs:  BP 127/74    Pulse 72    Ht 5\' 3"  (1.6 m)    Wt 164 lb (74.4 kg)    SpO2 98%    BMI 29.05 kg/m    Neurological Exam: MENTAL STATUS including orientation to self.  She does not know the year, home address, telephone number.  For date of birth, she says [birth year] but does not know the year.   Unable to follow simple commands, such as showing left thumb or two fingers to nthe right hand.   CRANIAL NERVES:    Normal conjugate, extra-ocular eye movements in all directions of gaze.  No ptosis   MOTOR:  Motor strength is 5/5 in all extremities.    COORDINATION/GAIT:  Ideomotor apraxia. Gait narrow based and stable.   Data: MRI brain 07/12/2019: 1.  No acute intracranial abnormality. 2. Chronic ischemic disease in the left cerebellum. And chronic bilateral cerebral hemisphere small vessel ischemic disease Suspected.  Ultrasound carotid 06/09/2019: Right Carotid: Velocities in the right ICA are consistent  with a 40-59% stenosis.                Non-hemodynamically significant plaque <50% noted in the CCA.                The ECA appears >50% stenosed.                The RICA velocities are elevated and stable compared to the prior exam.   Left Carotid: Velocities in the left ICA are consistent with a 40-59% stenosis, based on peak systolic velocities and plaque formation.                 Non-hemodynamically significant plaque noted in the CCA.               The LICA velocities are elevated and stable compared to the prior exam.   Vertebrals:  Right vertebral artery demonstrates antegrade flow. Small caliber left vertebral artery with atypical, antegrade flow. Subclavians:  Bilateral subclavian artery flow was disturbed.   *See table(s) above for measurements and observations. Suggest follow up study in 12 months.     Lab Results  Component Value Date   CHOL 177 12/27/2020   HDL 70.10 12/27/2020   LDLCALC 94 12/27/2020   TRIG 65.0 12/27/2020   CHOLHDL 3 12/27/2020   Neuropsychological testing 10/06/2019: Mild neurocognitive disorder to the severe end of the spectrum Neuropsychological testing 07/31/2020:  Major neurocognitive disorder  IMPRESSION/PLAN: 1.  Alzheimer's Dementia, progressive cognitive decline, requiring assistance with all ADLS except for feeding.  She needs 24-hour supervision and understandably caregiver burden is high.  I recommend they consider inhome care or memory care unit.  -Increase Aricept to 20 mg daily  -Continue Namenda 10 mg twice daily  -Dementia care resources were provided.  They may want to contact Social Security office for home health aide   -Recommend getting ID band and setting up POA  2.  Bilateral carotid stenosis, moderate (40-59%)  - stable in 05/2020 - mutually decided to defer [place] due to progressive dementia  3.  History of ischemic left cerebellar stroke, age uncertain, asymptomatic.  - Continue ASA 81mg   - Continue lipitor to 40mg  daily  - Praised for tobacco cessation  Return to clinic in 8 months  Total time spent reviewing records, interview, history/exam, documentation, and coordination of care on day of encounter:  30 min  Thank you for allowing me to participate in patient's care.  If I can answer any additional questions, I would be pleased to do so.    Sincerely,    Azlin Zilberman K. , DO

## 2022-01-02 NOTE — Patient Instructions (Signed)
Increase Aricept to 20mg  daily (take two 10mg  tablet) Continue Namenda 10mg  twice daily  Return to clinic in October

## 2022-01-06 ENCOUNTER — Telehealth: Payer: Self-pay | Admitting: Neurology

## 2022-01-06 MED ORDER — DONEPEZIL HCL 23 MG PO TABS: 23.0000 mg | ORAL_TABLET | Freq: Every day | ORAL | 3 refills | Status: DC

## 2022-01-06 NOTE — Telephone Encounter (Signed)
Called and left a message for patient or patients husband to give me a call back.

## 2022-01-06 NOTE — Telephone Encounter (Signed)
Rec'd letter from [facility] that her donepezil 10mg  BID will not be covered due to quantity limit.  I will increase the dose to donepezil 23mg  daily.

## 2022-01-06 NOTE — Telephone Encounter (Signed)
Called patients husband and left a message for him to return my call. Just need to inform him that we have corrected Donepezil due to insurance issues.

## 2022-01-06 NOTE — Telephone Encounter (Signed)
Spoke to patients husband and informed him that a corrected rx has been sent to pharmacy. Patients husband is aware to contact our office if he has any issues, questions, or concerns.

## 2022-01-12 ENCOUNTER — Encounter: Payer: Self-pay | Admitting: Family Medicine

## 2022-01-12 ENCOUNTER — Ambulatory Visit (INDEPENDENT_AMBULATORY_CARE_PROVIDER_SITE_OTHER): Payer: Medicare HMO | Admitting: Family Medicine

## 2022-01-12 VITALS — BP 118/62 | HR 68 | Temp 98.1°F | Ht 63.0 in | Wt 162.4 lb

## 2022-01-12 DIAGNOSIS — Z1231 Encounter for screening mammogram for malignant neoplasm of breast: Secondary | ICD-10-CM

## 2022-01-12 DIAGNOSIS — Z0000 Encounter for general adult medical examination without abnormal findings: Secondary | ICD-10-CM | POA: Diagnosis not present

## 2022-01-12 DIAGNOSIS — E785 Hyperlipidemia, unspecified: Secondary | ICD-10-CM | POA: Diagnosis not present

## 2022-01-12 DIAGNOSIS — Z1211 Encounter for screening for malignant neoplasm of colon: Secondary | ICD-10-CM

## 2022-01-12 NOTE — Progress Notes (Signed)
Chief Complaint  Patient presents with   Annual Exam     Well Woman Mikayla Ross is here for a complete physical. Here w her spouse who helps w hx. Her last physical was >1 year ago.  Current diet: in general, a "healthy" diet. Current exercise: none. Weight is stable and she denies daytime fatigue. Seatbelt? Yes Advanced directive? No  Health Maintenance Colonoscopy- No Shingrix- No DEXA- Yes Mammogram- No Tetanus- Yes Pneumonia- Yes Hep C screen- Yes  Past Medical History:  Diagnosis Date   Bilateral carotid artery stenosis 01/05/2018   Estrogen deficiency 02/14/2018   High cholesterol    Hyperlipidemia 10/06/2019   Major neurocognitive disorder, unclear etiology 07/31/2020   possible Alzheimer's disease   Obstructive sleep apnea    Right-sided Bell's palsy 12/27/2017   Stroke    Asymptomatic ischemic left cerebellar stroke     Past Surgical History:  Procedure Laterality Date   ABDOMINAL HYSTERECTOMY     APPENDECTOMY     TONSILLECTOMY      Medications  Current Outpatient Medications on File Prior to Visit  Medication Sig Dispense Refill   atorvastatin (LIPITOR) 40 MG tablet Take 1 tablet (40 mg total) by mouth daily. 90 tablet 2   buPROPion (WELLBUTRIN XL) 150 MG 24 hr tablet Take 1 tablet (150 mg total) by mouth daily. 30 tablet 6   donepezil (ARICEPT) 23 MG TABS tablet Take 1 tablet (23 mg total) by mouth at bedtime. 90 tablet 3   memantine (NAMENDA) 10 MG tablet Take 1 tablet (10 mg total) by mouth 2 (two) times daily. 180 tablet 2   Multiple Vitamin (MULTIVITAMIN) tablet Take 1 tablet by mouth daily.     QUEtiapine (SEROQUEL) 25 MG tablet TAKE 1 TABLET BY MOUTH AT BEDTIME 90 tablet 2   sertraline (ZOLOFT) 50 MG tablet Take 1 tablet by mouth once daily 90 tablet 0    Allergies No Known Allergies  Review of Systems: Constitutional:  no fevers Eye:  no recent significant change in vision Ears:  No changes in hearing Nose/Mouth/Throat:  no complaints of  nasal congestion, no sore throat Cardiovascular: no chest pain Respiratory:  No shortness of breath Gastrointestinal:  No change in bowel habits GU:  Female: negative for dysuria Integumentary:  no abnormal skin lesions reported Neurologic:  no headaches Endocrine:  denies unexplained weight changes  Exam BP 118/62    Pulse 68    Temp 98.1 F (36.7 C) (Oral)    Ht 5\' 3"  (1.6 m)    Wt 162 lb 6 oz (73.7 kg)    SpO2 99%    BMI 28.76 kg/m  General:  well developed, well nourished, in no apparent distress Skin:  no significant moles, warts, or growths Head:  no masses, lesions, or tenderness Eyes:  pupils equal and round, sclera anicteric without injection Ears:  canals without lesions, TMs shiny without retraction, no obvious effusion, no erythema Nose:  nares patent, septum midline, mucosa normal, and no drainage or sinus tenderness Throat/Pharynx:  lips and gingiva without lesion; tongue and uvula midline; non-inflamed pharynx; no exudates or postnasal drainage Neck: neck supple without adenopathy, thyromegaly, or masses Lungs:  clear to auscultation, breath sounds equal bilaterally, no respiratory distress Cardio:  regular rate and rhythm, no bruits or LE edema Abdomen:  abdomen soft, nontender; bowel sounds normal; no masses or organomegaly Genital: Deferred Neuro:  gait normal; deep tendon reflexes normal in UE's, hyperreflexic in patellar refexes and symmetric Psych: normal affect, limited judgment/insight  Assessment and Plan  Well adult exam  Screen for colon cancer - Plan: Ambulatory referral to Gastroenterology  Encounter for screening mammogram for malignant neoplasm of breast - Plan: MM DIGITAL SCREENING BILATERAL  Hyperlipidemia, unspecified hyperlipidemia type - Plan: Comprehensive metabolic panel, Lipid panel   Well 71 y.o. female. Counseled on diet and exercise. Advanced directive form provided today.  Mammogram order placed.  GI referral placed for [facility].  Other  orders as above. Follow up in 6 mo or prn. The patient and her spouse voiced understanding and agreement to the plan.  Jilda Roche [employer], DO 01/12/22 9:20 AM

## 2022-01-12 NOTE — Patient Instructions (Addendum)
Give [place] 2-3 business days to get the results of your labs back.   Keep the diet clean and stay active.  Aim to do some physical exertion for 150 minutes per week. This is typically divided into 5 days per week, 30 minutes per day. The activity should be enough to get your heart rate up. Anything is better than nothing if you have time constraints.  If you do not hear anything about your referral in the next 1-2 weeks, call our office and ask for an update.  The Shingrix vaccine (for shingles) is a 2 shot series. It can make people feel low energy, achy and almost like they have the flu for 48 hours after injection. Please plan accordingly when deciding on when to get this shot. Call our office for a nurse visit appointment to get this. The second shot of the series is less severe regarding the side effects, but it still lasts 48 hours.   Let us know if you need anything.

## 2022-01-13 LAB — COMPREHENSIVE METABOLIC PANEL
ALT: 12 U/L (ref 0–35)
AST: 13 U/L (ref 0–37)
Albumin: 4.4 g/dL (ref 3.5–5.2)
Alkaline Phosphatase: 53 U/L (ref 39–117)
BUN: 18 mg/dL (ref 6–23)
CO2: 34 mEq/L — ABNORMAL HIGH (ref 19–32)
Calcium: 9.7 mg/dL (ref 8.4–10.5)
Chloride: 106 mEq/L (ref 96–112)
Creatinine, Ser: 0.88 mg/dL (ref 0.40–1.20)
GFR: 67.02 mL/min (ref >60.00)
Glucose, Bld: 81 mg/dL (ref 70–99)
Potassium: 4 mEq/L (ref 3.5–5.1)
Sodium: 142 mEq/L (ref 135–145)
Total Bilirubin: 0.4 mg/dL (ref 0.2–1.2)
Total Protein: 6.5 g/dL (ref 6.0–8.3)

## 2022-01-13 LAB — LIPID PANEL
Cholesterol: 177 mg/dL (ref 0–200)
HDL: 75.4 mg/dL (ref >39.00)
LDL Cholesterol: 83 mg/dL (ref 0–99)
NonHDL: 101.91
Total CHOL/HDL Ratio: 2
Triglycerides: 97 mg/dL (ref 0.0–149.0)
VLDL: 19.4 mg/dL (ref 0.0–40.0)

## 2022-01-14 ENCOUNTER — Other Ambulatory Visit: Payer: Self-pay | Admitting: Family Medicine

## 2022-01-14 DIAGNOSIS — G309 Alzheimer's disease, unspecified: Secondary | ICD-10-CM

## 2022-01-15 ENCOUNTER — Other Ambulatory Visit: Payer: Self-pay | Admitting: Family Medicine

## 2022-01-15 DIAGNOSIS — F02818 Dementia in other diseases classified elsewhere, unspecified severity, with other behavioral disturbance: Secondary | ICD-10-CM

## 2022-01-15 DIAGNOSIS — G309 Alzheimer's disease, unspecified: Secondary | ICD-10-CM

## 2022-01-17 ENCOUNTER — Telehealth (HOSPITAL_BASED_OUTPATIENT_CLINIC_OR_DEPARTMENT_OTHER): Payer: Self-pay

## 2022-02-13 ENCOUNTER — Other Ambulatory Visit: Payer: Self-pay | Admitting: Family Medicine

## 2022-02-13 DIAGNOSIS — G309 Alzheimer's disease, unspecified: Secondary | ICD-10-CM

## 2022-03-02 ENCOUNTER — Ambulatory Visit (HOSPITAL_BASED_OUTPATIENT_CLINIC_OR_DEPARTMENT_OTHER)
Admission: RE | Admit: 2022-03-02 | Discharge: 2022-03-02 | Disposition: A | Discharge status: Home / Self Care | Payer: Medicare HMO | Source: Ambulatory Visit | Attending: Family Medicine | Admitting: Family Medicine

## 2022-03-02 ENCOUNTER — Encounter (HOSPITAL_BASED_OUTPATIENT_CLINIC_OR_DEPARTMENT_OTHER): Payer: Self-pay

## 2022-03-02 DIAGNOSIS — Z1231 Encounter for screening mammogram for malignant neoplasm of breast: Secondary | ICD-10-CM | POA: Diagnosis not present

## 2022-03-09 ENCOUNTER — Other Ambulatory Visit: Payer: Self-pay | Admitting: Family Medicine

## 2022-03-09 DIAGNOSIS — F02818 Dementia in other diseases classified elsewhere, unspecified severity, with other behavioral disturbance: Secondary | ICD-10-CM

## 2022-03-12 ENCOUNTER — Other Ambulatory Visit: Payer: Self-pay | Admitting: Family Medicine

## 2022-03-12 DIAGNOSIS — F02818 Dementia in other diseases classified elsewhere, unspecified severity, with other behavioral disturbance: Secondary | ICD-10-CM

## 2022-03-27 ENCOUNTER — Telehealth: Payer: Self-pay | Admitting: Family Medicine

## 2022-03-27 NOTE — Telephone Encounter (Signed)
Left message for patient to call back and schedule Medicare Annual Wellness Visit (AWV).   Please offer to do virtually or by telephone.  Left office number and my jabber #[phone].  AWVI eligible as of 06/30/2018  Please schedule at anytime with Nurse Health Advisor.   

## 2022-05-11 ENCOUNTER — Other Ambulatory Visit: Payer: Self-pay | Admitting: Family Medicine

## 2022-05-11 DIAGNOSIS — E785 Hyperlipidemia, unspecified: Secondary | ICD-10-CM

## 2022-07-06 ENCOUNTER — Other Ambulatory Visit: Payer: Self-pay | Admitting: Family Medicine

## 2022-07-06 DIAGNOSIS — F02818 Dementia in other diseases classified elsewhere, unspecified severity, with other behavioral disturbance: Secondary | ICD-10-CM

## 2022-08-14 ENCOUNTER — Other Ambulatory Visit: Payer: Self-pay | Admitting: Family Medicine

## 2022-08-14 DIAGNOSIS — E785 Hyperlipidemia, unspecified: Secondary | ICD-10-CM

## 2022-08-25 ENCOUNTER — Encounter: Payer: Self-pay | Admitting: Neurology

## 2022-08-26 ENCOUNTER — Encounter: Payer: Self-pay | Admitting: Neurology

## 2022-09-09 ENCOUNTER — Encounter: Payer: Self-pay | Admitting: Neurology

## 2022-09-09 ENCOUNTER — Ambulatory Visit: Payer: Medicare HMO | Admitting: Neurology

## 2022-09-09 DIAGNOSIS — G309 Alzheimer's disease, unspecified: Secondary | ICD-10-CM | POA: Diagnosis not present

## 2022-09-09 DIAGNOSIS — R69 Illness, unspecified: Secondary | ICD-10-CM | POA: Diagnosis not present

## 2022-09-09 DIAGNOSIS — F02818 Dementia in other diseases classified elsewhere, unspecified severity, with other behavioral disturbance: Secondary | ICD-10-CM | POA: Diagnosis not present

## 2022-09-09 MED ORDER — QUETIAPINE FUMARATE 25 MG PO TABS
25.0000 mg | ORAL_TABLET | Freq: Two times a day (BID) | ORAL | 3 refills | Status: DC
Start: 2022-09-09 — End: 2022-12-01

## 2022-09-09 MED ORDER — MEMANTINE HCL 10 MG PO TABS
10.0000 mg | ORAL_TABLET | Freq: Two times a day (BID) | ORAL | 3 refills | Status: DC
Start: 2022-09-09 — End: 2022-11-04

## 2022-09-09 NOTE — Addendum Note (Signed)
Addended by: Renae Gloss on: 09/09/2022 02:58 PM   Modules accepted: Orders

## 2022-09-09 NOTE — Progress Notes (Signed)
Follow-up Visit   Date: 09/09/22   Mikayla Ross MRN: [id number] DOB: 08-Feb-1951   Interim History: Mikayla Ross is a 71 y.o. right-handed female with history of R Bell's palpsy, hyperlipidemia, OSA, and former smoker returning to the clinic for follow-up of dementia and carotid stenosis.  She is accompanied by her husband.  UPDATE 12/30/2020: She is here for follow-up visit. Husband has noticed progressive worsening in memory.  She is repeating herself much more, she has more mood swings for which her Zoloft was increased to 100mg /d.  Husband tried to encourage her to do puzzles, books, but she does not have any motivation or interest.  She is very sedentary and spends all day watching TV.  No new complaints.   UPDATE 01/02/2022:  She is here for follow-up visit.  Since her last visit, husband reports progressive decline in independent functioning.  She requires assistance for dressing and undressing herself, as well as bathing.  He says she can go into the shower wearing all of her clothes.  She is unable to use basic appliances such as a telephone.  She does not have any hobbies and spends most of the day watching TV.  Mood and sleep is okay.  She takes Wellbutrin 150 mg daily.  She is also on Aricept 10 mg and Namenda 10 mg twice daily.  I spent is having greater difficulty trying to keep up with household duties because of the amount of time given to patient needs.  UPDATE 09/09/2022:  She is here for follow-up visit.  Husband has noticed worsening irritability and agitation.  She is aware of her self and family. She is completely dependent on care, except for feedings.  She needs 24 hr supervision, which is exhausting Mr. Stites since he is the primary care giver.  She is not following commands as easily as before and needs prompting repeatedly.  For instance, she will say she needs to go to the bathroom, but just stands there and even after being told to pull down her pants, she is unable to  follow instructions.  She wanders around the home and did wander outside on one occasion and found down their street. Husband would like resources to help decide on getting care at home or looking at memory care units.    Medications:  Current Outpatient Medications on File Prior to Visit  Medication Sig Dispense Refill   atorvastatin (LIPITOR) 40 MG tablet Take 1 tablet (40 mg total) by mouth daily. 30 tablet 0   buPROPion (WELLBUTRIN XL) 150 MG 24 hr tablet Take 1 tablet by mouth once daily 90 tablet 1   donepezil (ARICEPT) 23 MG TABS tablet Take 1 tablet (23 mg total) by mouth at bedtime. 90 tablet 3   Multiple Vitamin (MULTIVITAMIN) tablet Take 1 tablet by mouth daily.     sertraline (ZOLOFT) 50 MG tablet Take 1 tablet (50 mg total) by mouth daily. 90 tablet 1   No current facility-administered medications on file prior to visit.    Allergies: No Known Allergies  Vital Signs:  BP 121/79   Pulse 73   Wt 142 lb (64.4 kg)   SpO2 96%   BMI 25.15 kg/m    Neurological Exam: MENTAL STATUS including orientation to self and correctly identifies husband.  She does not know the year, home address, telephone number.   Unable to follow simple commands, such as stand up and walk.   CRANIAL NERVES:    Normal conjugate, extra-ocular eye  movements in all directions of gaze.  No ptosis   MOTOR:  Motor strength is 5/5 in all extremities.    COORDINATION/GAIT:  Ideomotor apraxia. Gait narrow based and stable.   Data: MRI brain 07/12/2019: 1.  No acute intracranial abnormality. 2. Chronic ischemic disease in the left cerebellum. And chronic bilateral cerebral hemisphere small vessel ischemic disease Suspected.  Ultrasound carotid 06/09/2019: Right Carotid: Velocities in the right ICA are consistent with a 40-59% stenosis.                Non-hemodynamically significant plaque <50% noted in the CCA.                The ECA appears >50% stenosed.                The RICA velocities are elevated  and stable compared to the prior exam.   Left Carotid: Velocities in the left ICA are consistent with a 40-59% stenosis, based on peak systolic velocities and plaque formation.                 Non-hemodynamically significant plaque noted in the CCA.               The LICA velocities are elevated and stable compared to the prior exam.   Vertebrals:  Right vertebral artery demonstrates antegrade flow. Small caliber left vertebral artery with atypical, antegrade flow. Subclavians: Bilateral subclavian artery flow was disturbed.   *See table(s) above for measurements and observations. Suggest follow up study in 12 months.     Lab Results  Component Value Date   CHOL 177 01/12/2022   HDL 75.40 01/12/2022   LDLCALC 83 01/12/2022   TRIG 97.0 01/12/2022   CHOLHDL 2 01/12/2022   Neuropsychological testing 10/06/2019: Mild neurocognitive disorder to the severe end of the spectrum Neuropsychological testing 07/31/2020:  Major neurocognitive disorder  IMPRESSION/PLAN: 1.  Alzheimer's Dementia, progressive cognitive decline, requiring assistance with all ADLS except for feeding.  She needs 24-hour supervision and understandably caregiver burden is high.  Long discussion about community resources and dementia care options - Home health referral for nursing care and social work  - Continue Aricept 23mg  daily - Continue Namenda 10mg  BID - For behavior, increase seroquel to 25mg  BID  - Recommend getting ID band  2.  Bilateral carotid stenosis, moderate (40-59%)  - stable in 05/2020 - mutually decided to defer [place] due to progressive dementia  3.  History of ischemic left cerebellar stroke, age uncertain, asymptomatic.  - Continue ASA 81mg   - Continue lipitor to 40mg  daily  - Praised for tobacco cessation  Return to clinic in 6 months  Total time spent reviewing records, interview, history/exam, documentation, and coordination of care on day of encounter:  30 min   Thank you for allowing me to  participate in patient's care.  If I can answer any additional questions, I would be pleased to do so.    Sincerely,    Truc Winfree K. Posey Pronto, DO

## 2022-09-09 NOTE — Patient Instructions (Addendum)
We will make a referral for home health to assist with in-home needs Increase seroquel to 25mg  - 1 tablet twice daily  [facility]:  [phone] Tel [place]:   [phone] www.[email or web address] - click under programs  Resources for [place] and Assisted Living facilities: www.[facility].com  For assistance with senior care, elder law, and estate planning (POA, medical directives): [employer] [address], [place]  [address] Tel: [phone] [email or web address]  Berneice Heinrich Tel: [phone] [email or web address]   Return to clinic in 6 months

## 2022-09-15 ENCOUNTER — Telehealth: Payer: Self-pay

## 2022-09-15 DIAGNOSIS — G309 Alzheimer's disease, unspecified: Secondary | ICD-10-CM

## 2022-09-15 NOTE — Telephone Encounter (Signed)
Mikayla Ross called stated that they got the home health referral, they do not have an aide but they have OT who can help and do aide needs they are asking if we can change the order

## 2022-09-16 NOTE — Telephone Encounter (Signed)
Mikayla Ross called OT order placed, I verified that they can do the social worker and he said that is no problem ,

## 2022-09-16 NOTE — Telephone Encounter (Signed)
That would be okay, but we had also requested social work- can they provide this?

## 2022-09-18 ENCOUNTER — Ambulatory Visit: Payer: Medicare HMO | Admitting: Neurology

## 2022-10-23 ENCOUNTER — Other Ambulatory Visit: Payer: Self-pay | Admitting: Family Medicine

## 2022-10-23 DIAGNOSIS — E785 Hyperlipidemia, unspecified: Secondary | ICD-10-CM

## 2022-10-26 ENCOUNTER — Other Ambulatory Visit: Payer: Self-pay | Admitting: Family Medicine

## 2022-10-26 DIAGNOSIS — E785 Hyperlipidemia, unspecified: Secondary | ICD-10-CM

## 2022-10-26 MED ORDER — ATORVASTATIN CALCIUM 40 MG PO TABS: 40.0000 mg | ORAL_TABLET | Freq: Every day | ORAL | 3 refills | Status: DC

## 2022-11-04 ENCOUNTER — Other Ambulatory Visit: Payer: Self-pay | Admitting: Family Medicine

## 2022-11-04 DIAGNOSIS — G309 Alzheimer's disease, unspecified: Secondary | ICD-10-CM

## 2022-11-05 IMAGING — MG MM DIGITAL SCREENING BILAT W/ TOMO AND CAD
6 of 10 series · 6 of 30 positions shown · non-contrast
Comparison: Previous exam(s).

CLINICAL DATA: Screening.

EXAM:
DIGITAL SCREENING BILATERAL MAMMOGRAM WITH TOMOSYNTHESIS AND CAD
TECHNIQUE: Bilateral screening digital craniocaudal and mediolateral oblique
mammograms were obtained. Bilateral screening digital breast
tomosynthesis was performed. The images were evaluated with
computer-aided detection.

[L MLO synth-2D]
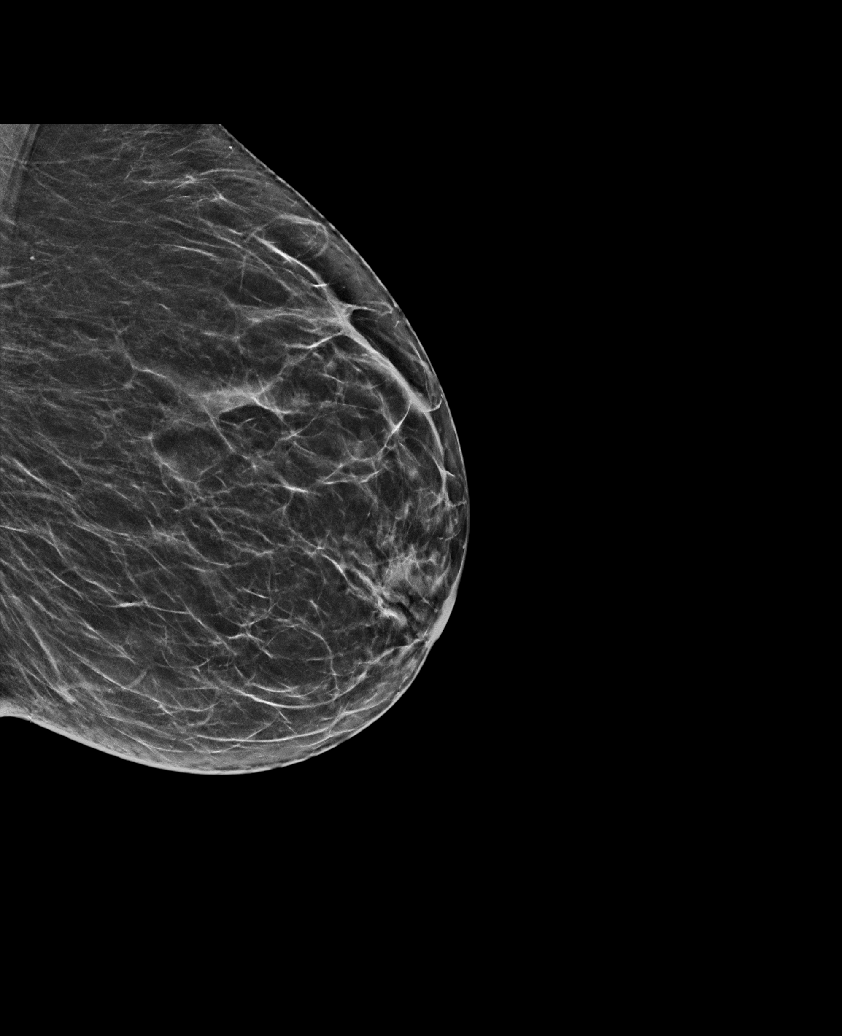

[R CC synth-2D]
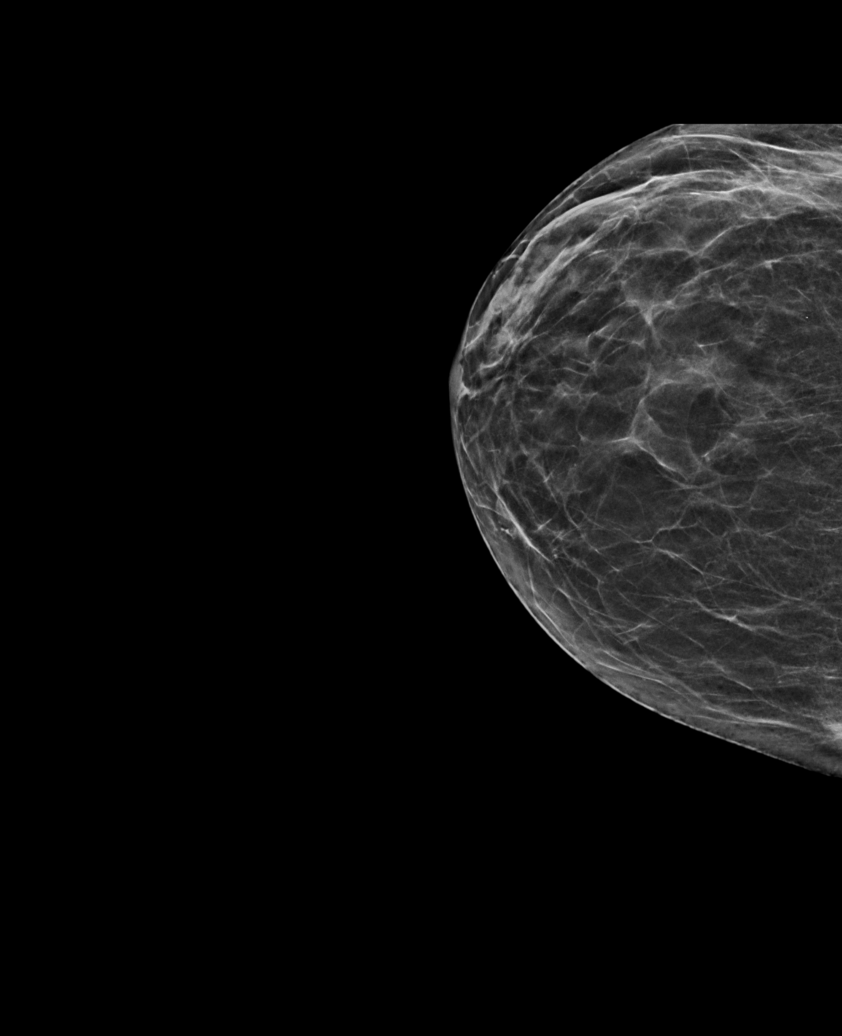

[R MLO synth-2D]
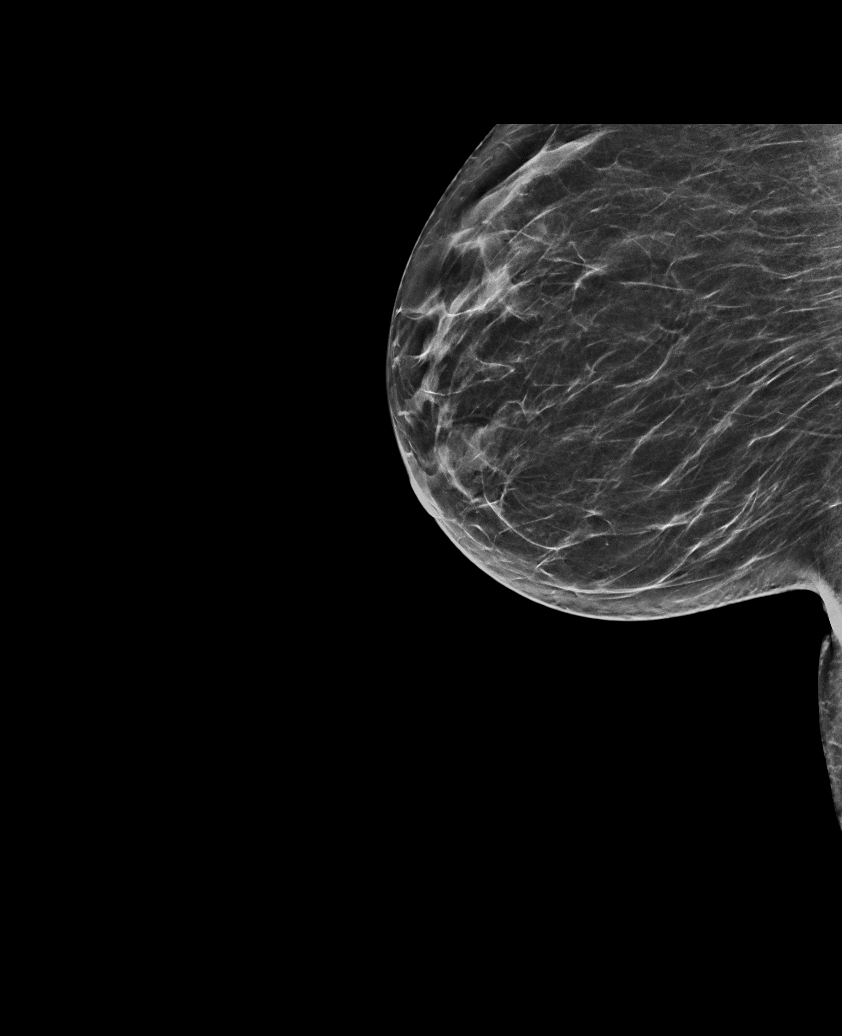

[L CC synth-2D]
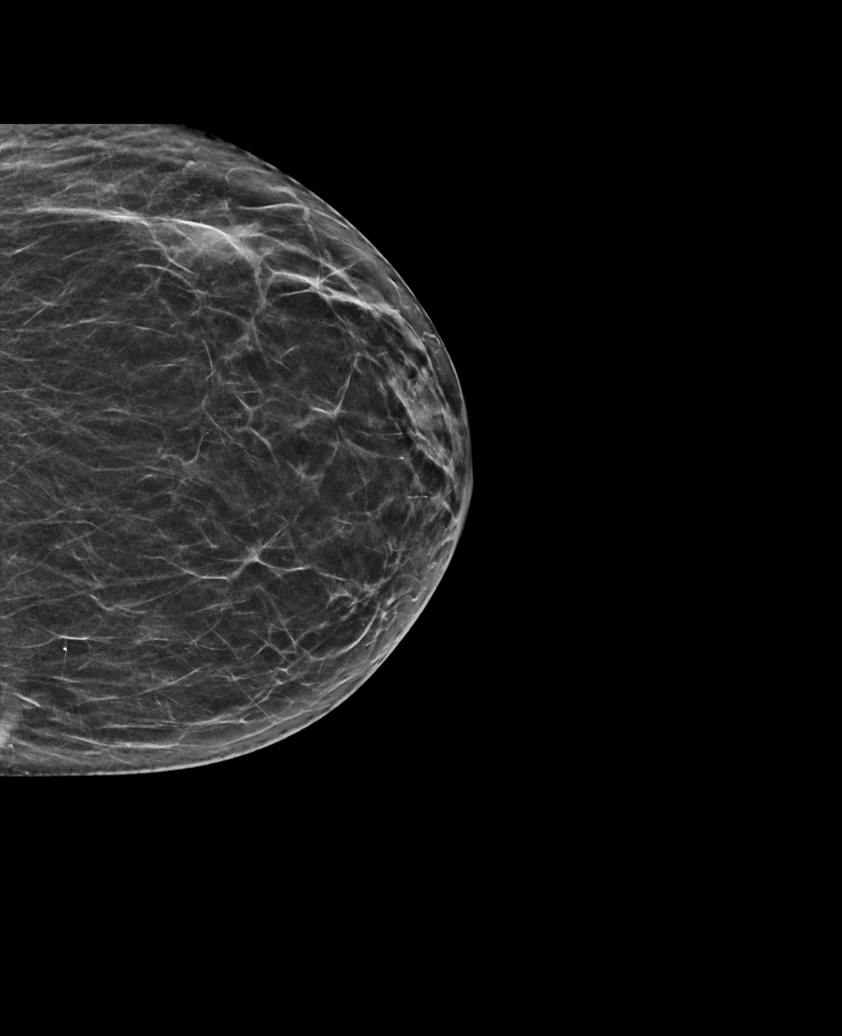

[R XCCL synth-2D]
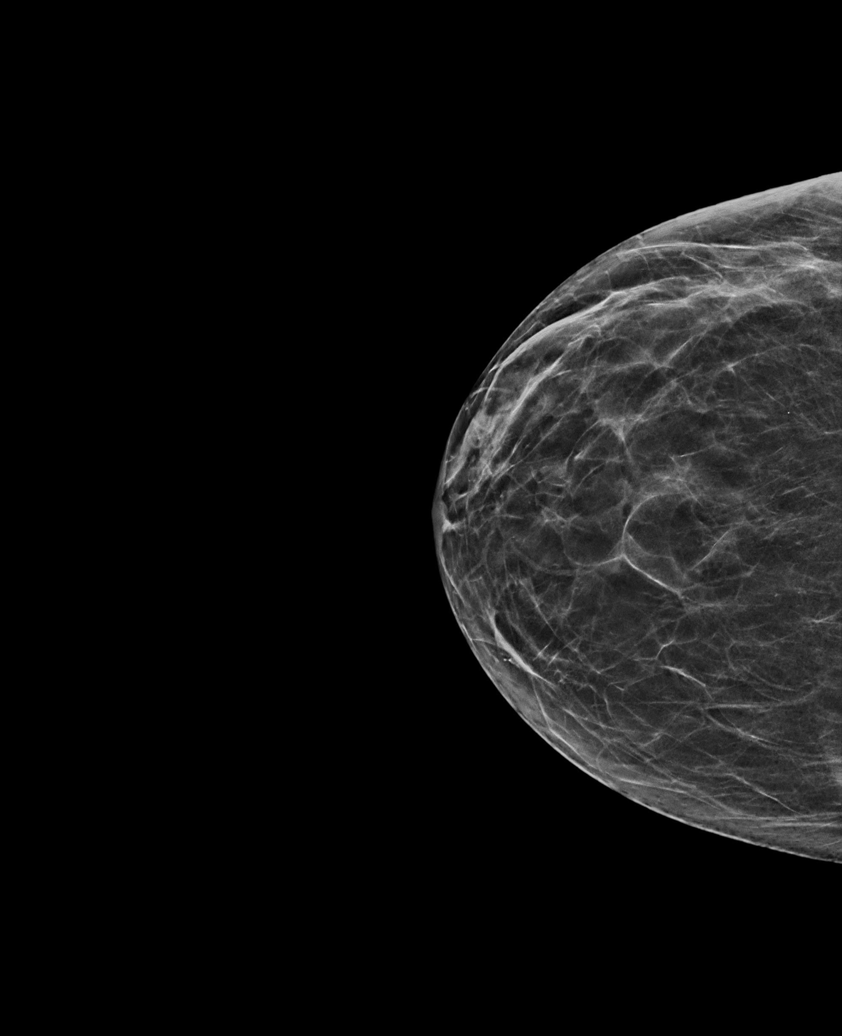

[L CC tomo · tomo slice 27/53.0]
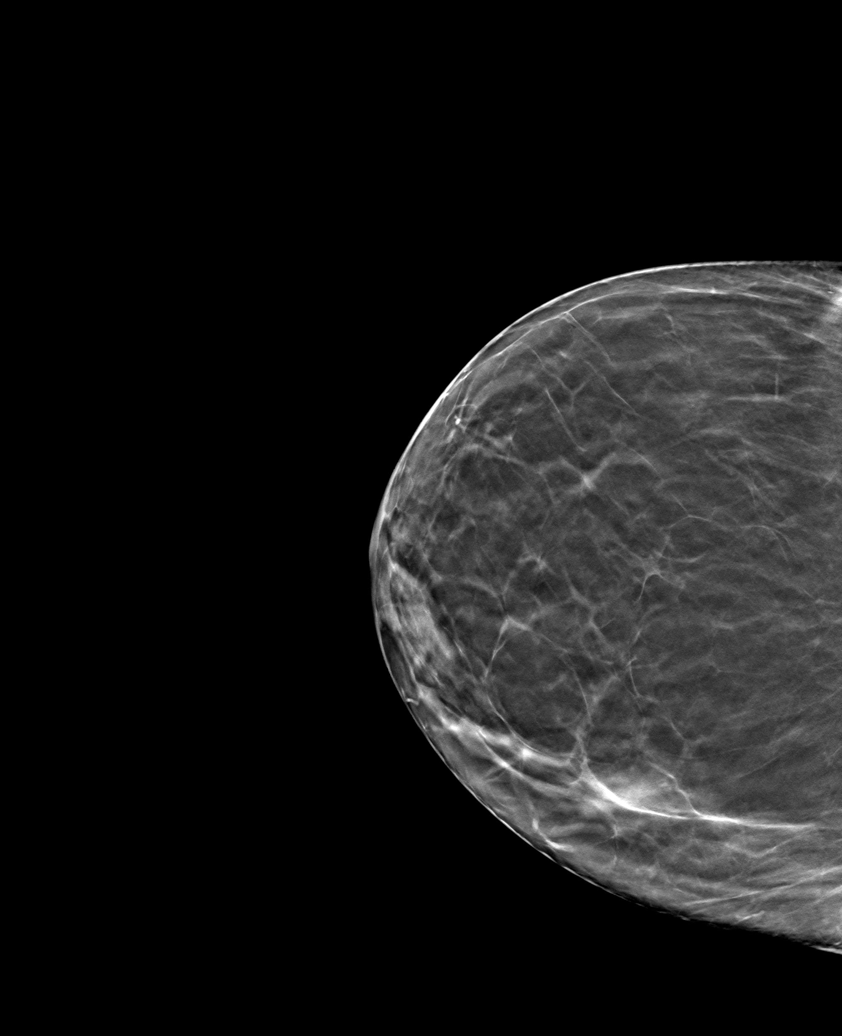

[6 of 30 positions shown; findings below may reference images not displayed]

ACR Breast Density Category b: There are scattered areas of
fibroglandular density.
FINDINGS: There are no findings suspicious for malignancy.
IMPRESSION: No mammographic evidence of malignancy. A result letter of this
screening mammogram will be mailed directly to the patient.

RECOMMENDATION:
Screening mammogram in one year. (Code:51-O-LD2)

BI-RADS CATEGORY  1: Negative.

## 2022-11-29 ENCOUNTER — Other Ambulatory Visit: Payer: Self-pay | Admitting: Family Medicine

## 2022-11-29 DIAGNOSIS — G309 Alzheimer's disease, unspecified: Secondary | ICD-10-CM

## 2023-02-11 ENCOUNTER — Other Ambulatory Visit: Payer: Self-pay | Admitting: Neurology

## 2023-02-11 DIAGNOSIS — F02818 Dementia in other diseases classified elsewhere, unspecified severity, with other behavioral disturbance: Secondary | ICD-10-CM

## 2023-02-13 ENCOUNTER — Other Ambulatory Visit: Payer: Self-pay | Admitting: Family Medicine

## 2023-02-13 ENCOUNTER — Other Ambulatory Visit: Payer: Self-pay | Admitting: Neurology

## 2023-02-13 DIAGNOSIS — F02818 Dementia in other diseases classified elsewhere, unspecified severity, with other behavioral disturbance: Secondary | ICD-10-CM

## 2023-02-19 ENCOUNTER — Other Ambulatory Visit: Payer: Self-pay | Admitting: Neurology

## 2023-02-19 DIAGNOSIS — F02818 Dementia in other diseases classified elsewhere, unspecified severity, with other behavioral disturbance: Secondary | ICD-10-CM

## 2023-03-16 ENCOUNTER — Telehealth: Payer: Self-pay | Admitting: Neurology

## 2023-03-16 ENCOUNTER — Ambulatory Visit: Payer: Medicare HMO | Admitting: Neurology

## 2023-03-16 ENCOUNTER — Encounter: Payer: Self-pay | Admitting: Neurology

## 2023-03-16 VITALS — Ht 63.0 in | Wt 126.0 lb

## 2023-03-16 DIAGNOSIS — G309 Alzheimer's disease, unspecified: Secondary | ICD-10-CM | POA: Diagnosis not present

## 2023-03-16 DIAGNOSIS — F02818 Dementia in other diseases classified elsewhere, unspecified severity, with other behavioral disturbance: Secondary | ICD-10-CM

## 2023-03-16 DIAGNOSIS — R69 Illness, unspecified: Secondary | ICD-10-CM | POA: Diagnosis not present

## 2023-03-16 DIAGNOSIS — F0390 Unspecified dementia without behavioral disturbance: Secondary | ICD-10-CM

## 2023-03-16 NOTE — Patient Instructions (Addendum)
If you are unable to give Aricept and Namenda, it would be okay.   If behavior becomes worse off Aricept, you can restart.  Please contact [facility] to help assist with placement

## 2023-03-16 NOTE — Progress Notes (Signed)
Follow-up Visit   Date: 03/16/23   GAE BIHL MRN: [id number] DOB: [birth year]   Interim History: Mikayla Ross is a 72 y.o. right-handed female with history of R Bell's palpsy, hyperlipidemia, OSA, and former smoker returning to the clinic for follow-up of dementia and carotid stenosis.  She is accompanied by her husband and his daughter.  There has been progressive functional decline since her last visit.  She is dependent on all ADLs, including feeding.  She tends to have poor personal hygiene and argues with husband when try to get her to brush teeth or bathe. They struggle with trying to get her to take medications and she does not always swallow the pills, so often have to check behind her.  She does not follow commands or answer questions.  Husband is getting very exhausted and unable to care for himself.  He is ready to transition her into memory care unit and needs assistance with this.    Medications:  Current Outpatient Medications on File Prior to Visit  Medication Sig Dispense Refill   atorvastatin (LIPITOR) 40 MG tablet Take 1 tablet (40 mg total) by mouth daily. 90 tablet 3   buPROPion (WELLBUTRIN XL) 150 MG 24 hr tablet Take 1 tablet by mouth once daily 90 tablet 1   donepezil (ARICEPT) 23 MG TABS tablet Take 1 tablet (23 mg total) by mouth at bedtime. 90 tablet 3   memantine (NAMENDA) 10 MG tablet Take 1 tablet (10 mg total) by mouth 2 (two) times daily. 60 tablet 0   Multiple Vitamin (MULTIVITAMIN) tablet Take 1 tablet by mouth daily.     QUEtiapine (SEROQUEL) 25 MG tablet TAKE 1 TABLET BY MOUTH AT BEDTIME 90 tablet 0   sertraline (ZOLOFT) 50 MG tablet Take 1 tablet (50 mg total) by mouth daily. 90 tablet 1   No current facility-administered medications on file prior to visit.    Allergies: No Known Allergies  Vital Signs:  Ht  (1.6 m)   Wt 126 lb (57.2 kg)   BMI 22.32 kg/m    Neurological Exam: MENTAL STATUS including orientation to self and  correctly identifies husband.  She does not identify husband's daughter.  She does not answer questions or correctly follow simple commands, such as show me two fingers or stand up and walk.  CRANIAL NERVES:    Normal conjugate, extra-ocular eye movements in all directions of gaze.  No ptosis   MOTOR:  Motor strength is 5/5 in all extremities.    COORDINATION/GAIT:  unable to assess  Data: MRI brain 07/12/2019: 1.  No acute intracranial abnormality. 2. Chronic ischemic disease in the left cerebellum. And chronic bilateral cerebral hemisphere small vessel ischemic disease Suspected.  Ultrasound carotid 06/09/2019: Right Carotid: Velocities in the right ICA are consistent with a 40-59% stenosis.                Non-hemodynamically significant plaque <50% noted in the CCA.                The ECA appears >50% stenosed.                The RICA velocities are elevated and stable compared to the prior exam.   Left Carotid: Velocities in the left ICA are consistent with a 40-59% stenosis, based on peak systolic velocities and plaque formation.                 Non-hemodynamically significant plaque noted in the CCA.  The LICA velocities are elevated and stable compared to the prior exam.   Vertebrals:  Right vertebral artery demonstrates antegrade flow. Small caliber left vertebral artery with atypical, antegrade flow. Subclavians: Bilateral subclavian artery flow was disturbed.   *See table(s) above for measurements and observations. Suggest follow up study in 12 months.     Lab Results  Component Value Date   CHOL 177 01/12/2022   HDL 75.40 01/12/2022   LDLCALC 83 01/12/2022   TRIG 97.0 01/12/2022   CHOLHDL 2 01/12/2022   Neuropsychological testing 10/06/2019: Mild neurocognitive disorder to the severe end of the spectrum Neuropsychological testing 07/31/2020:  Major neurocognitive disorder  IMPRESSION/PLAN: 1.  Alzheimer's Dementia, progressive cognitive and functional  decline, requiring assistance with all ADLS including feeding. High level of caregiver burden and he does not have any extra support at home to care for her, so he is ready to transition her into memory care unit.  - Information provided for [employer] to help find a facility  - Discussed that at this time, management is really palliative and if he is unable to get her to take Aricept and Namenda, it would be okay.  Sometimes, however, coming off Aricept may cause worsening behavior, so instructed to monitor for this.  - Continue seroquel  at bedtime  2.  Bilateral carotid stenosis, moderate (40-59%).  Stable in 2021.  - mutually decided to defer [place] due to progressive dementia  3.  History of ischemic left cerebellar stroke, age uncertain, asymptomatic.  - Continue ASA   - Continue lipitor to  daily  Return to clinic as needed  Total time spent reviewing records, interview, history/exam, documentation, and coordination of care on day of encounter:  30 min    Thank you for allowing me to participate in patient's care.  If I can answer any additional questions, I would be pleased to do so.    Sincerely,    Sheridyn Canino K. Allena Katz, DO

## 2023-03-16 NOTE — Telephone Encounter (Signed)
Patient daughter said patient was in the office this morning and she has some questions  and would like a call back

## 2023-03-17 NOTE — Telephone Encounter (Signed)
Spoke to patients daughter who spoke to [employer] [facility] [employer] and they are wanting to know if patient needs Memory Care or Secured Skilled Nursing?   She also stated that this is not a free service. They are charging them $150 for a hour to consult with a nurse.Once that is done they require a fee of $1500 for ten hours of service to help patient get placed.   Patients daughter stated if she does not answer to leave a detailed message on her answering machine with the information.

## 2023-03-18 ENCOUNTER — Telehealth: Payer: Self-pay | Admitting: *Deleted

## 2023-03-18 NOTE — Telephone Encounter (Signed)
Called patients daughter and left a detailed message informing her of below with Dr. Allena Katz and Dr. Hollie Beach assistance. Left contact information incase she needs to contact our office.

## 2023-03-18 NOTE — Telephone Encounter (Signed)
Referral for [facility] placed for urgent 7 days , or did you want me to change it urgent 3 days

## 2023-03-18 NOTE — Progress Notes (Signed)
  Care Coordination  Outreach Note  03/18/2023 Name: [name] MRN: [id number] DOB: Apr 03, 1951   Care Coordination Outreach Attempts: An unsuccessful telephone outreach was attempted today to offer the patient information about available care coordination services as a benefit of their health plan.   Follow Up Plan:  Additional outreach attempts will be made to offer the patient care coordination information and services.   Encounter Outcome:  No Answer  Burman Nieves, CCMA Care Coordination Care Guide Direct Dial: [phone]

## 2023-03-19 ENCOUNTER — Other Ambulatory Visit: Payer: Self-pay | Admitting: Family Medicine

## 2023-03-19 ENCOUNTER — Other Ambulatory Visit: Payer: Self-pay | Admitting: Neurology

## 2023-03-19 DIAGNOSIS — F02818 Dementia in other diseases classified elsewhere, unspecified severity, with other behavioral disturbance: Secondary | ICD-10-CM

## 2023-03-19 NOTE — Progress Notes (Signed)
  Care Coordination   Note   03/19/2023 Name: Mikayla Ross MRN: [id number] DOB: [birth year]  Mikayla Ross is a 72 y.o. year old female who sees Carmelia Roller, Jilda Roche, DO for primary care. I reached out to Mikayla Ross by phone today to offer care coordination services.  [name] was given information about Care Coordination services today including:   The Care Coordination services include support from the care team which includes your Nurse Coordinator, Clinical Social Worker, or Pharmacist.  The Care Coordination team is here to help remove barriers to the health concerns and goals most important to you. Care Coordination services are voluntary, and the patient may decline or stop services at any time by request to their care team member.   Care Coordination Consent Status: Patient agreed to services and verbal consent obtained.   Follow up plan:  Telephone appointment with care coordination team member scheduled for:  03/22/2023  Encounter Outcome:  Pt. Scheduled  Burman Nieves, CCMA Care Coordination Care Guide Direct Dial: [phone]

## 2023-03-22 ENCOUNTER — Ambulatory Visit: Payer: Self-pay | Admitting: Licensed Clinical Social Worker

## 2023-03-22 NOTE — Patient Outreach (Signed)
  Care Coordination  Initial Visit Note   03/22/2023 Name: QUANTASIA STEGNER MRN: [id number] DOB: 02-06-1951  Lynnette Charna Elizabeth is a 72 y.o. year old female who sees Carmelia Roller, Jilda Roche, DO for primary care. I spoke with  July G Giaimo's husband Dorene Sorrow and dau. Mickel Crow [phone] by phone today.  What matters to the patients health and wellness today?  Getting patient placed in memory care.  Patient currently lives with husband and dau.     Goals Addressed             This Visit's Progress    Caregiver support with placement       Activities and task to complete in order to accomplish goals.  Dau. And husband will assist  Call or go to Department of Social Services to apply for Special Assistance Medicaid Review facilities from the list provided call and go visit  Follow up on information you received from the [employer] about POA, HPOA and life insurance policies I will work with Dr. Carmelia Roller for the [facility] when you are ready       SDOH assessments and interventions completed:  Yes  SDOH Interventions Today    Flowsheet Row Most Recent Value  SDOH Interventions   Food Insecurity Interventions Intervention Not Indicated  Housing Interventions Intervention Not Indicated  Transportation Interventions Intervention Not Indicated     Care Coordination Interventions:  Yes, provided  Interventions Today    Flowsheet Row Most Recent Value  Chronic Disease   Chronic disease during today's visit Other  [Alzheimer's Dementia]  General Interventions   General Interventions Discussed/Reviewed Level of Care  Level of Care Skilled Nursing Facility, Assisted Living  [reviewed process and discussed FL2]  Mental Health Interventions   Mental Health Discussed/Reviewed Other  [solution focused, task centered]  Safety Interventions   Safety Discussed/Reviewed Safety Reviewed  [Patient is not left home alone]  Advanced Directive Interventions   Advanced Directives Discussed/Reviewed  Advanced Care Planning, Advanced Directives Discussed       Follow up plan: Follow up call scheduled for 04/05/23    Encounter Outcome:  Pt. Visit Completed   Sammuel Hines, LCSW Social Work Care Coordination  [facility] [employer] [employer] [phone]

## 2023-03-22 NOTE — Patient Instructions (Signed)
Visit Information  Thank you for taking time to visit with me today. Please don't hesitate to contact me if I can be of assistance to you.   Following are the goals we discussed today:   Goals Addressed             This Visit's Progress    Caregiver support with placement       Activities and task to complete in order to accomplish goals.  Dau. And husband will assist  Call or go to Department of Social Services to apply for Special Assistance Medicaid Review facilities from the list provided call and go visit  Follow up on information you received from the [employer] about POA, HPOA and life insurance policies I will work with Dr. Carmelia Roller for the [facility] when you are ready        Our next appointment is by telephone on 04/05/23 at 8:30  Please call the care guide team at [phone] if you need to cancel or reschedule your appointment.    The patient verbalized understanding of instructions, educational materials, and care plan provided today and DECLINED offer to receive copy of patient instructions, educational materials, and care plan.   Sammuel Hines, LCSW Social Work Care Coordination  [facility] [employer] [employer] [phone]

## 2023-03-23 ENCOUNTER — Ambulatory Visit: Payer: Medicare HMO | Admitting: Neurology

## 2023-04-05 ENCOUNTER — Ambulatory Visit: Payer: Self-pay | Admitting: Licensed Clinical Social Worker

## 2023-04-05 NOTE — Patient Instructions (Signed)
Visit Information  Thank you for taking time to visit with me today. Please don't hesitate to contact me if I can be of assistance to you.   Following are the goals we discussed today:   Goals Addressed             This Visit's Progress    Caregiver support with placement       Activities and task to complete in order to accomplish goals.  Dau. And husband will assist  Call or go to Department of Social Services to apply for Special Assistance Medicaid Review facilities from the list provided call and go visit  Follow up on information you received from the [employer] about POA, HPOA and life insurance policies I will work with Dr. Carmelia Roller for the [facility] when you are ready         Our next appointment is by telephone on 04/28/23 at 8:30  Please call the care guide team at [phone] if you need to cancel or reschedule your appointment.    The patient's family verbalized understanding of instructions, educational materials, and care plan provided today and DECLINED offer to receive copy of patient instructions, educational materials, and care plan.   Sammuel Hines, LCSW Social Work Care Coordination  [facility] [employer] [phone]

## 2023-04-05 NOTE — Patient Outreach (Signed)
  Care Coordination  Follow Up Visit Note   04/05/2023 Name: Mikayla Ross MRN: [id number] DOB: 12/02/1950  Mikayla Ross is a 72 y.o. year old female who sees Carmelia Roller, Jilda Roche, DO for primary care. I spoke with  Sidnie G Haffey's daughter Mickel Crow by phone today.  What matters to the patients health and wellness today?  Finding a facility for patient.  Daughter has not been able to make much progress with placement .  She will continue to work on the task listed below.   Goals Addressed             This Visit's Progress    Caregiver support with placement       Activities and task to complete in order to accomplish goals.  Dau. And husband will assist  Call or go to Department of Social Services to apply for Special Assistance Medicaid Review facilities from the list provided call and go visit  Follow up on information you received from the [employer] about POA, HPOA and life insurance policies I will work with Dr. Carmelia Roller for the [facility] when you are ready        SDOH assessments and interventions completed:  No   Care Coordination Interventions:  Yes, provided  Interventions Today    Flowsheet Row Most Recent Value  Chronic Disease   Chronic disease during today's visit --  [Dementia]  General Interventions   General Interventions Discussed/Reviewed General Interventions Reviewed, Level of Care  [solution focused, task centered,]  Level of Care Assisted Living       Follow up plan: Follow up call scheduled for 3 weeks, Daughter will call sooner if needed.    Encounter Outcome:  Pt. Visit Completed   Sammuel Hines, LCSW Social Work Care Coordination  [facility] [employer] [phone]

## 2023-04-13 ENCOUNTER — Ambulatory Visit: Payer: Medicare HMO | Admitting: Neurology

## 2023-04-16 ENCOUNTER — Ambulatory Visit: Payer: Self-pay | Admitting: Licensed Clinical Social Worker

## 2023-04-16 NOTE — Patient Instructions (Signed)
Social Work Visit Information  Thank you for taking time to visit with me today. Please don't hesitate to contact me if I can be of assistance to you.   Following are the goals we discussed today:   Goals Addressed             This Visit's Progress    Caregiver support with placement       Activities and task to complete in order to accomplish goals.  Dau. And husband will assist  Call or go to Department of Social Services to apply for Special Assistance Medicaid Review facilities from the list provided call and go visit  Follow up on information you received from the [employer] about POA, HPOA and life insurance policies  the [facility] has been faxed to [facility] per your request          No follow up scheduled with social work at this time. Will follow up in 60 days if no contact from family.Patient's family will call office or call social work if needed.  Please call the care guide team at [phone] if you need to cancel or reschedule your appointment.   If you or anyone you know are experiencing a Mental Health or Behavioral Health Crisis or need someone to talk to, please call the Suicide and Crisis Lifeline: 988 call the [place] National Suicide Prevention Lifeline: (763)526-1299 or TTY: (605) 495-4049 TTY 978 215 2529) to talk to a trained counselor call 1-800-273-TALK (toll free, 24 hour hotline) go to [facility] [address], [place] [phone])   The patient verbalized understanding of instructions, educational materials, and care plan provided today and DECLINED offer to receive copy of patient instructions, educational materials, and care plan.     Sammuel Hines, LCSW Social Work Care Coordination  [facility] [employer] [employer] [phone]

## 2023-04-16 NOTE — Patient Outreach (Signed)
  Care Coordination  Follow Up Visit Note   04/16/2023 Name: Mikayla Ross MRN: [id number] DOB: 1951/09/11  Mikayla Ross is a 72 y.o. year old female who sees Mikayla Ross, Mikayla Roche, DO for primary care. I spoke with  Mikayla Ross's daughter Mikayla Ross by phone today.  What matters to the patients health and wellness today?  Collaboration for placement    Goals Addressed             This Visit's Progress    Caregiver support with placement       Activities and task to complete in order to accomplish goals.  Dau. And husband will assist  Call or go to Department of Social Services to apply for Special Assistance Medicaid Review facilities from the list provided call and go visit  Follow up on information you received from the [employer]ators about POA, HPOA and life insurance policies  the [facility] has been faxed to [facility] per your request         SDOH assessments and interventions completed:  No   Care Coordination Interventions:  Yes, provided  Interventions Today    Flowsheet Row Most Recent Value  Chronic Disease   Chronic disease during today's visit Other  General Interventions   General Interventions Discussed/Reviewed Communication with, Level of Care  Communication with PCP/Specialists  [[place]]  Level of Care Assisted Living  [facility] care]  Applications FL-2  [completed, approved by PCP and sent to [facility]]  Education Interventions   Applications FL-2  [completed, approved by PCP and sent to [facility]]       Follow up plan:  No follow up Scheduled, family will contact LCSW when additional support is needed.    Encounter Outcome:  Pt. Visit Completed   Sammuel Hines, LCSW Social Work Care Coordination  [facility] [employer] [phone]

## 2023-04-28 ENCOUNTER — Encounter: Payer: Medicare HMO | Admitting: Licensed Clinical Social Worker

## 2023-04-29 ENCOUNTER — Ambulatory Visit: Payer: Self-pay | Admitting: Licensed Clinical Social Worker

## 2023-04-29 NOTE — Patient Instructions (Signed)
Social Work Visit Information  Thank you for taking time to visit with me today. Please don't hesitate to contact me if I can be of assistance to you.   Following are the goals we discussed today:   Goals Addressed             This Visit's Progress    Caregiver support with placement       Activities and task to complete in order to accomplish goals.  Dau. And husband will assist  Call or go to Department of Social Services to apply for Special Assistance Medicaid Continue to review facilities from the list provided call and go visit  Continue working with  [facility]   Will work with PCP to get the [facility]  faxed to [facility] per your request  Per your request I will start the process for PASARR #         No follow up scheduled with social work at this time. Will follow up as needed during placement process, family will call if needed prior to next encounter.  Please call the care guide team at [phone] if you need to cancel or reschedule your appointment.    The patient 's family verbalized understanding of instructions, educational materials, and care plan provided today and DECLINED offer to receive copy of patient instructions, educational materials, and care plan.      Sammuel Hines, LCSW Social Work Care Coordination  [facility] [employer] [employer] [phone]

## 2023-04-29 NOTE — Patient Outreach (Signed)
  Care Coordination  Follow Up Visit Note   04/29/2023 Name: Mikayla Ross MRN: [id number] DOB: [birth year]  Mikayla Ross is a 72 y.o. year old female who sees Carmelia Roller, Jilda Roche, DO for primary care. I spoke with  Mikayla Ross's daughter by phone today. Returned phone call.  What matters to the patients health and wellness today?  Getting her placed in a facility  Patient was declined at [facility],  family continues to explore other facility options. LCSW will continue to collaborate to assist with placement process. ( FL2 sent to PCP , PASSAR is currently pending, waiting for return call from new facility [facility]; contact person Elvina Sidle [phone])   Goals Addressed             This Visit's Progress    Caregiver support with placement       Activities and task to complete in order to accomplish goals.  Dau. And husband will assist  Call or go to Department of Social Services to apply for Special Assistance Medicaid Continue to review facilities from the list provided call and go visit  Continue working with  [facility]   Will work with PCP to get the [facility]  faxed to [facility] per your request  Per your request I will start the process for PASARR #        SDOH assessments and interventions completed:  No   Care Coordination Interventions:  Yes, provided  Interventions Today    Flowsheet Row Most Recent Value  General Interventions   General Interventions Discussed/Reviewed General Interventions Reviewed, Communication with, Level of Care  Communication with PCP/Specialists  [CMA,  Admissions director,]  Level of Care Skilled Nursing Facility  Applications FL-2, [place] #]  Education Interventions   Applications FL-2, Other  [PASSAR #]  Advanced Directive Interventions   Advanced Directives Discussed/Reviewed Advanced Directives Discussed  [per daughter husband has documents, she and husband are both listed.  Requested they  provide a copy for the chart]       Follow up plan:  No f/u scheduled will continue to collaborate with PCP and family for placement as needed    Encounter Outcome:  Pt. Visit Completed   Mikayla Hines, LCSW Social Work Care Coordination  [facility] [employer] [employer] [phone]

## 2023-05-03 ENCOUNTER — Ambulatory Visit: Payer: Self-pay | Admitting: Licensed Clinical Social Worker

## 2023-05-03 NOTE — Patient Outreach (Signed)
  Care Coordination  Follow Up Visit Note   05/03/2023 Name: Mikayla Ross MRN: [id number] DOB: 04/10/1951  Mikayla Ross is a 72 y.o. year old female who sees Carmelia Roller, Jilda Roche, DO for primary care. I spoke with  Mikayla Ross;s daughter Mikayla Ross by phone today.  What matters to the patients health and wellness today?  Getting patient placed in a facility that is able to meet her needs.  FL2 faxed to facility PASSAR # has been approved Daughter will continue to work with facility and contact LCSW when [facility] is ready to be faxed to DSS for Special Assistance Medicaid.   Goals Addressed             This Visit's Progress    Caregiver support with placement       Activities and task to complete in order to accomplish goals.  Dau. And husband will assist  Call or go to Department of Social Services to apply for Special Assistance Medicaid Continue to review facilities from the list provided call and go visit  Continue working with  [facility]   FL2  has been faxed to [facility] per your request Per your request I will start the process for PASARR it has been approved.  PASRR Number: [id number]        SDOH assessments and interventions completed:  No   Care Coordination Interventions:  Yes, provided  Interventions Today    Flowsheet Row Most Recent Value  General Interventions   General Interventions Discussed/Reviewed General Interventions Reviewed  Communication with PCP/Specialists  Applications FL-2, Other  [PASSAR]  Education Interventions   Applications FL-2, Other  [PASSAR]       Follow up plan:  No f/u scheduled, daughter will contact LCSW when they are ready for the next step.    Encounter Outcome:  Pt. Visit Completed   Sammuel Hines, LCSW Social Work Care Coordination  [facility] [employer] [phone]

## 2023-05-03 NOTE — Patient Instructions (Signed)
Social Work Visit Information  Thank you for taking time to visit with me today. Please don't hesitate to contact me if I can be of assistance to you.   Following are the goals we discussed today:   Goals Addressed             This Visit's Progress    Caregiver support with placement       Activities and task to complete in order to accomplish goals.  Dau. And husband will assist  Call or go to Department of Social Services to apply for Special Assistance Medicaid Continue to review facilities from the list provided call and go visit  Continue working with  [facility]   FL2  has been faxed to [facility] per your request Per your request I will start the process for PASARR it has been approved.  PASRR Number: [id number]         No follow up scheduled with social work at this time. Will follow up in 3 weeks .Patient will call office if needed prior to next encounter.  Please call the care guide team at [phone] if you need to cancel or reschedule your appointment.    The patient verbalized understanding of instructions, educational materials, and care plan provided today and DECLINED offer to receive copy of patient instructions, educational materials, and care plan.     Sammuel Hines, LCSW Social Work Care Coordination  [facility] [employer] [phone]

## 2023-05-05 ENCOUNTER — Telehealth: Payer: Self-pay | Admitting: Neurology

## 2023-05-05 ENCOUNTER — Encounter: Payer: Self-pay | Admitting: Neurology

## 2023-05-05 NOTE — Telephone Encounter (Signed)
Letter has been faxed to Elvina Sidle to fax number#:[phone]. Patients daughter aware.

## 2023-05-05 NOTE — Telephone Encounter (Signed)
Pt daughter wants to speak with someone about getting her mother into a skilled nursing place. She states that Dr Allena Katz notes over rode the [facility] 2 form that was filled out by the PCP. Per Daughter Allena Katz notes mention that patient needs memory care and they want her in skilled nursing.they will need a letter stating that she would be ok in skilled nursing.

## 2023-05-06 NOTE — Telephone Encounter (Signed)
[facility] and she stated Mikayla Ross has not received the fax. Re-faxed on both fax machines in office and informed [name] to let me know if it has not been received.

## 2023-05-06 NOTE — Telephone Encounter (Signed)
[name] earls wants to speak with [facility] [phone]

## 2023-05-07 ENCOUNTER — Telehealth: Payer: Self-pay | Admitting: Neurology

## 2023-05-07 DIAGNOSIS — F0390 Unspecified dementia without behavioral disturbance: Secondary | ICD-10-CM

## 2023-05-07 DIAGNOSIS — F02818 Dementia in other diseases classified elsewhere, unspecified severity, with other behavioral disturbance: Secondary | ICD-10-CM

## 2023-05-07 NOTE — Telephone Encounter (Signed)
OK to send home health referral for nursing care (medication management), PT.

## 2023-05-07 NOTE — Telephone Encounter (Signed)
Called patients daughter and informed her that we have sent the Home Health referral to [facility]. Patients daughter thanked [place] for the call.

## 2023-05-07 NOTE — Addendum Note (Signed)
Addended by: Karl Luke A on: 05/07/2023 09:09 AM   Modules accepted: Orders

## 2023-05-07 NOTE — Telephone Encounter (Signed)
Pt daughter wants to see if we can help get the patient home health care until they can get her in somewhere  please call

## 2023-05-10 NOTE — Telephone Encounter (Signed)
It would be reasonable to place palliative care/hospice referral for advanced dementia.

## 2023-05-10 NOTE — Telephone Encounter (Signed)
Spoke to patients daughter and they stated the letter that was faxed to the facility patient was to be placed in was unable to accept her due to an emergency that caused some rearranging of beds leaving patient with no bed in the facility. Patients daughter stated that the patient advocate we told them about asked them to speak to [place] about a possible hospice referral. That way someone can come into the home and help with the patient until she gets placed in a facility. Informed patients daughter I would send Dr. Allena Katz a message and get back to her about this.

## 2023-05-11 ENCOUNTER — Telehealth: Payer: Self-pay | Admitting: Neurology

## 2023-05-11 NOTE — Telephone Encounter (Signed)
Called Mikayla Ross back and informed her we are sending the referral to palliative/hospice care for her mother.

## 2023-05-11 NOTE — Addendum Note (Signed)
Addended by: Karl Luke A on: 05/11/2023 08:10 AM   Modules accepted: Orders

## 2023-05-11 NOTE — Telephone Encounter (Signed)
Pt's daughter called in and left a message. She is returning Mahina's call.

## 2023-05-11 NOTE — Telephone Encounter (Signed)
Called and left a message for [name] that we are going to send a palliative/hospice referral as requested. Left call back number in case she has any questions.

## 2023-05-18 ENCOUNTER — Telehealth: Payer: Self-pay | Admitting: Neurology

## 2023-05-18 NOTE — Telephone Encounter (Signed)
Left message with the after hour service on 05-18-23 at 12:10 pm     Needs to speak with someone about a referral that was done last week

## 2023-05-19 NOTE — Telephone Encounter (Signed)
Spoke to Patients daughter and she stated she has not heard anything back from [facility] Palliative care. Informed patients daughter that I will resend the referral and provided her with their phone number so she can call them.

## 2023-05-19 NOTE — Telephone Encounter (Signed)
Pt daughter called and would like someone to call her back about this

## 2023-05-25 ENCOUNTER — Telehealth: Payer: Self-pay | Admitting: Neurology

## 2023-05-25 ENCOUNTER — Ambulatory Visit: Payer: Self-pay | Admitting: Licensed Clinical Social Worker

## 2023-05-25 NOTE — Telephone Encounter (Signed)
Patient daughter needs to speak to someone about patient not needing memory care and needs SNF  please call

## 2023-05-25 NOTE — Patient Outreach (Signed)
  Care Coordination  Follow Up Visit Note   05/25/2023 Name: Mikayla Ross MRN: [id number] DOB: 05-27-1951  Mikayla Ross is a 71 y.o. year old female who sees Carmelia Roller, Jilda Roche, DO for primary care. I spoke with  Mikayla Ross's daughter Mikayla Ross by phone today.  What matters to the patients health and wellness today?  Assistance with placement for patient  LCSW worked with PCP to get [facility] signed and faxed the facility.   Goals Addressed             This Visit's Progress    Caregiver support with placement       Activities and task to complete in order to accomplish goals.  Dau. And husband will assist  Continue working with  [facility] for placement The FL2  was re faxed to [facility]  [phone] per your request to the attention of Elvina Sidle, she has confirmed that FL2 was received today PASRR Number: [id number] A has also been provided to facility        SDOH assessments and interventions completed:  No  Care Coordination Interventions:  Yes, provided  Interventions Today    Flowsheet Row Most Recent Value  General Interventions   General Interventions Discussed/Reviewed General Interventions Reviewed, Level of Care, Communication with  Communication with PCP/Specialists  Level of Care Skilled Nursing Facility, Applications  Applications FL-2  Education Interventions   Education Provided Provided Education  [solution focused,  active listening]  Applications FL-2       Follow up plan:  No follow up scheduled, daughter will contact LCSW as needed.    Encounter Outcome:  Pt. Visit Completed   Sammuel Hines, LCSW Social Work Care Coordination  [facility] [employer] [phone]

## 2023-05-25 NOTE — Patient Instructions (Signed)
Social Work Visit Information  Thank you for taking time to visit with me today. Please don't hesitate to contact me if I can be of assistance to you.   Following are the goals we discussed today:   Goals Addressed             This Visit's Progress    Caregiver support with placement       Activities and task to complete in order to accomplish goals.  Dau. And husband will assist  Continue working with  [facility] for placement The FL2  was re faxed to [facility]  [phone] per your request to the attention of Elvina Sidle, she has confirmed that FL2 was received today PASRR Number: [id number] A has also been provided to facility          No follow up scheduled with social work at this time. Will follow up in 30 days as needed .Patient 's family will call office if needed  Please call the care guide team at [phone] if you need to cancel or reschedule your appointment.    The patient 's daughter verbalized understanding of instructions, educational materials, and care plan provided today and DECLINED offer to receive copy of patient instructions, educational materials, and care plan.     Sammuel Hines, LCSW Social Work Care Coordination  [facility] [employer] [phone]

## 2023-05-26 NOTE — Telephone Encounter (Signed)
Called and spoke to [place] and she stated that there will be a bed available soon for patient in the skilled nursing facility but they need another letter faxed. In this letter they need it to say that patient no longer requires memory care and needs skilled nursing/nursing care. Letter needs to be faxed to [facility] Sidle at [phone].   Informed patients daughter I will send this message to Dr. Allena Katz and let her know once completed.

## 2023-05-26 NOTE — Telephone Encounter (Signed)
Please call she left a VM needing to speak to someone about the patient

## 2023-05-27 ENCOUNTER — Telehealth: Payer: Self-pay | Admitting: Neurology

## 2023-05-27 NOTE — Telephone Encounter (Signed)
Needs to speak to a nurse about a fax per her VM

## 2023-05-27 NOTE — Telephone Encounter (Signed)
Done

## 2023-05-27 NOTE — Telephone Encounter (Signed)
Spoke to [place] and she stated that the letter needs to state that patient does not need memory care and needs the skill nursing/nursing care. The reason for this is because the memory unit stated that patient requires to much care in the memory care unit and there is not enough nurses to provide her the amount of care she needs. Patient requires Skilled Nursing/Nursing care.  I informed [name] that I would send Dr. Allena Katz the reasoning of why she cannot get the memory care in the facility and needs to be transferred to the higher level of care that patient is requiring.

## 2023-05-27 NOTE — Telephone Encounter (Signed)
Letter has been faxed to [facility].   Called patients daughter [name] and informed her that the letter has been faxed.

## 2023-05-27 NOTE — Telephone Encounter (Signed)
Letter printed, please fax to Elvina Sidle at [phone].

## 2023-05-28 NOTE — Telephone Encounter (Signed)
Letter faxed.

## 2023-06-18 ENCOUNTER — Other Ambulatory Visit: Payer: Self-pay | Admitting: Family Medicine

## 2023-06-18 DIAGNOSIS — G309 Alzheimer's disease, unspecified: Secondary | ICD-10-CM

## 2023-06-21 ENCOUNTER — Ambulatory Visit: Payer: Self-pay | Admitting: Licensed Clinical Social Worker

## 2023-06-21 NOTE — Patient Instructions (Signed)
Social Work Visit Information  Thank you for taking time to visit with me today. Please don't hesitate to contact me if I can be of assistance to you.   Following are the goals we discussed today:   Goals Addressed             This Visit's Progress    COMPLETED: Caregiver support with placement       Activities and task to complete in order to accomplish goals.  Dau. And husband will assist  Placement on Hold at this time Continue working with  [facility] for placement The FL2  was re faxed to [facility]  [phone] per your request to the attention of Elvina Sidle, she has confirmed that FL2 was received today PASRR Number: [id number] A has also been provided to facility         No follow up scheduled with social work at this time. Will follow up in 60 days .Patient will call office if needed prior to next encounter.  Please call the care guide team at [phone] if you need to cancel or reschedule your appointment.    The patient 's daughter verbalized understanding of instructions, educational materials, and care plan provided today and DECLINED offer to receive copy of patient instructions, educational materials, and care plan.    Sammuel Hines, LCSW Social Work Care Coordination  [facility] [employer] [phone]

## 2023-06-21 NOTE — Patient Outreach (Signed)
  Care Coordination  Follow Up Visit Note   06/21/2023 Name: CALYPSO HAGARTY MRN: [id number] DOB: 20-Jan-1951  Aidee Charna Elizabeth is a 72 y.o. year old female who sees Carmelia Roller, Jilda Roche, DO for primary care. I spoke with  Viviana G Cumberledge's daughter by phone today.  What matters to the patients health and wellness today?    Per daughter placement is on hold at this time. Facility decided not to make the bed office.   She has not worked on additional placement but will contact LCSW if needed in the future.   Goals Addressed             This Visit's Progress    COMPLETED: Caregiver support with placement       Activities and task to complete in order to accomplish goals.  Dau. And husband will assist  Placement on Hold at this time Continue working with  [facility] for placement The FL2  was re faxed to [facility]  [phone] per your request to the attention of Elvina Sidle, she has confirmed that FL2 was received today PASRR Number: [id number] A has also been provided to facility         SDOH assessments and interventions completed:  No   Care Coordination Interventions:  Yes, provided  Interventions Today    Flowsheet Row Most Recent Value  General Interventions   General Interventions Discussed/Reviewed General Interventions Reviewed, Level of Care  Level of Care Assisted Living, Skilled Nursing Facility  [at a stand still at this time.]       Follow up plan: No further intervention required. Daughter will contact LCSW as needed. If no needs are identified in the next 60 day, LCSW will disconnect from care team.  Sammuel Hines, LCSW Social Work Care Coordination  [facility] [phone]   Encounter Outcome:  Pt. Visit Completed

## 2023-06-26 ENCOUNTER — Emergency Department (HOSPITAL_COMMUNITY): Payer: Medicare HMO

## 2023-06-26 ENCOUNTER — Inpatient Hospital Stay (HOSPITAL_COMMUNITY)
Admission: EM | Admit: 2023-06-26 | Discharge: 2023-06-28 | DRG: 871 | Disposition: A | Discharge status: Hospice | Payer: Medicare HMO | Attending: Internal Medicine | Admitting: Internal Medicine

## 2023-06-26 ENCOUNTER — Other Ambulatory Visit: Payer: Self-pay

## 2023-06-26 ENCOUNTER — Encounter (HOSPITAL_COMMUNITY): Payer: Self-pay

## 2023-06-26 DIAGNOSIS — Z90710 Acquired absence of both cervix and uterus: Secondary | ICD-10-CM

## 2023-06-26 DIAGNOSIS — J90 Pleural effusion, not elsewhere classified: Secondary | ICD-10-CM | POA: Diagnosis not present

## 2023-06-26 DIAGNOSIS — J984 Other disorders of lung: Secondary | ICD-10-CM

## 2023-06-26 DIAGNOSIS — R6520 Severe sepsis without septic shock: Secondary | ICD-10-CM | POA: Diagnosis present

## 2023-06-26 DIAGNOSIS — E876 Hypokalemia: Secondary | ICD-10-CM | POA: Diagnosis present

## 2023-06-26 DIAGNOSIS — A419 Sepsis, unspecified organism: Principal | ICD-10-CM | POA: Diagnosis present

## 2023-06-26 DIAGNOSIS — R231 Pallor: Secondary | ICD-10-CM | POA: Diagnosis not present

## 2023-06-26 DIAGNOSIS — Z9049 Acquired absence of other specified parts of digestive tract: Secondary | ICD-10-CM

## 2023-06-26 DIAGNOSIS — F17210 Nicotine dependence, cigarettes, uncomplicated: Secondary | ICD-10-CM | POA: Diagnosis present

## 2023-06-26 DIAGNOSIS — F02818 Dementia in other diseases classified elsewhere, unspecified severity, with other behavioral disturbance: Secondary | ICD-10-CM | POA: Diagnosis not present

## 2023-06-26 DIAGNOSIS — R7401 Elevation of levels of liver transaminase levels: Secondary | ICD-10-CM | POA: Diagnosis not present

## 2023-06-26 DIAGNOSIS — I2489 Other forms of acute ischemic heart disease: Secondary | ICD-10-CM | POA: Diagnosis present

## 2023-06-26 DIAGNOSIS — J9601 Acute respiratory failure with hypoxia: Secondary | ICD-10-CM | POA: Diagnosis not present

## 2023-06-26 DIAGNOSIS — I700 Atherosclerosis of aorta: Secondary | ICD-10-CM | POA: Diagnosis present

## 2023-06-26 DIAGNOSIS — E861 Hypovolemia: Secondary | ICD-10-CM | POA: Diagnosis present

## 2023-06-26 DIAGNOSIS — J690 Pneumonitis due to inhalation of food and vomit: Secondary | ICD-10-CM | POA: Diagnosis not present

## 2023-06-26 DIAGNOSIS — R911 Solitary pulmonary nodule: Secondary | ICD-10-CM | POA: Diagnosis present

## 2023-06-26 DIAGNOSIS — R0689 Other abnormalities of breathing: Secondary | ICD-10-CM | POA: Diagnosis not present

## 2023-06-26 DIAGNOSIS — N179 Acute kidney failure, unspecified: Secondary | ICD-10-CM | POA: Diagnosis not present

## 2023-06-26 DIAGNOSIS — Z515 Encounter for palliative care: Secondary | ICD-10-CM | POA: Diagnosis not present

## 2023-06-26 DIAGNOSIS — E785 Hyperlipidemia, unspecified: Secondary | ICD-10-CM | POA: Diagnosis present

## 2023-06-26 DIAGNOSIS — E782 Mixed hyperlipidemia: Secondary | ICD-10-CM

## 2023-06-26 DIAGNOSIS — Z66 Do not resuscitate: Secondary | ICD-10-CM | POA: Diagnosis present

## 2023-06-26 DIAGNOSIS — L8990 Pressure ulcer of unspecified site, unspecified stage: Secondary | ICD-10-CM | POA: Insufficient documentation

## 2023-06-26 DIAGNOSIS — G4733 Obstructive sleep apnea (adult) (pediatric): Secondary | ICD-10-CM | POA: Diagnosis present

## 2023-06-26 DIAGNOSIS — E860 Dehydration: Secondary | ICD-10-CM | POA: Diagnosis not present

## 2023-06-26 DIAGNOSIS — E870 Hyperosmolality and hypernatremia: Secondary | ICD-10-CM | POA: Diagnosis present

## 2023-06-26 DIAGNOSIS — Z841 Family history of disorders of kidney and ureter: Secondary | ICD-10-CM

## 2023-06-26 DIAGNOSIS — L89151 Pressure ulcer of sacral region, stage 1: Secondary | ICD-10-CM | POA: Diagnosis not present

## 2023-06-26 DIAGNOSIS — E7800 Pure hypercholesterolemia, unspecified: Secondary | ICD-10-CM | POA: Diagnosis present

## 2023-06-26 DIAGNOSIS — K5641 Fecal impaction: Secondary | ICD-10-CM | POA: Diagnosis present

## 2023-06-26 DIAGNOSIS — Z8673 Personal history of transient ischemic attack (TIA), and cerebral infarction without residual deficits: Secondary | ICD-10-CM

## 2023-06-26 DIAGNOSIS — R918 Other nonspecific abnormal finding of lung field: Secondary | ICD-10-CM | POA: Diagnosis not present

## 2023-06-26 DIAGNOSIS — G309 Alzheimer's disease, unspecified: Secondary | ICD-10-CM | POA: Diagnosis present

## 2023-06-26 DIAGNOSIS — H109 Unspecified conjunctivitis: Secondary | ICD-10-CM | POA: Diagnosis present

## 2023-06-26 DIAGNOSIS — Z1152 Encounter for screening for COVID-19: Secondary | ICD-10-CM

## 2023-06-26 DIAGNOSIS — J851 Abscess of lung with pneumonia: Secondary | ICD-10-CM | POA: Diagnosis present

## 2023-06-26 DIAGNOSIS — R1310 Dysphagia, unspecified: Secondary | ICD-10-CM | POA: Diagnosis present

## 2023-06-26 DIAGNOSIS — R0602 Shortness of breath: Secondary | ICD-10-CM | POA: Diagnosis not present

## 2023-06-26 DIAGNOSIS — Z79899 Other long term (current) drug therapy: Secondary | ICD-10-CM

## 2023-06-26 DIAGNOSIS — J189 Pneumonia, unspecified organism: Secondary | ICD-10-CM

## 2023-06-26 DIAGNOSIS — R404 Transient alteration of awareness: Secondary | ICD-10-CM | POA: Diagnosis not present

## 2023-06-26 DIAGNOSIS — E878 Other disorders of electrolyte and fluid balance, not elsewhere classified: Secondary | ICD-10-CM | POA: Diagnosis present

## 2023-06-26 DIAGNOSIS — R0902 Hypoxemia: Secondary | ICD-10-CM | POA: Diagnosis not present

## 2023-06-26 LAB — I-STAT VENOUS BLOOD GAS, ED
Acid-Base Excess: 5 mmol/L — ABNORMAL HIGH (ref 0.0–2.0)
Bicarbonate: 29.1 mmol/L — ABNORMAL HIGH (ref 20.0–28.0)
Calcium, Ion: 1.21 mmol/L (ref 1.15–1.40)
HCT: 35 % — ABNORMAL LOW (ref 36.0–46.0)
Hemoglobin: 11.9 g/dL — ABNORMAL LOW (ref 12.0–15.0)
O2 Saturation: 92 %
Patient temperature: 37
Potassium: 2.7 mmol/L — CL (ref 3.5–5.1)
Sodium: 157 mmol/L — ABNORMAL HIGH (ref 135–145)
TCO2: 30 mmol/L (ref 22–32)
pCO2, Ven: 39.3 mmHg — ABNORMAL LOW (ref 44–60)
pH, Ven: 7.477 — ABNORMAL HIGH (ref 7.25–7.43)
pO2, Ven: 59 mmHg — ABNORMAL HIGH (ref 32–45)

## 2023-06-26 LAB — COMPREHENSIVE METABOLIC PANEL
ALT: 106 U/L — ABNORMAL HIGH (ref 0–44)
AST: 67 U/L — ABNORMAL HIGH (ref 15–41)
Albumin: 2.3 g/dL — ABNORMAL LOW (ref 3.5–5.0)
Alkaline Phosphatase: 232 U/L — ABNORMAL HIGH (ref 38–126)
Anion gap: 19 — ABNORMAL HIGH (ref 5–15)
BUN: 40 mg/dL — ABNORMAL HIGH (ref 8–23)
CO2: 25 mmol/L (ref 22–32)
Calcium: 9.5 mg/dL (ref 8.9–10.3)
Chloride: 112 mmol/L — ABNORMAL HIGH (ref 98–111)
Creatinine, Ser: 1.25 mg/dL — ABNORMAL HIGH (ref 0.44–1.00)
GFR, Estimated: 46 mL/min — ABNORMAL LOW (ref >60)
Glucose, Bld: 151 mg/dL — ABNORMAL HIGH (ref 70–99)
Potassium: 2.7 mmol/L — CL (ref 3.5–5.1)
Sodium: 156 mmol/L — ABNORMAL HIGH (ref 135–145)
Total Bilirubin: 0.9 mg/dL (ref 0.3–1.2)
Total Protein: 6.5 g/dL (ref 6.5–8.1)

## 2023-06-26 LAB — CBC WITH DIFFERENTIAL/PLATELET
Abs Immature Granulocytes: 0 10*3/uL (ref 0.00–0.07)
Basophils Absolute: 0 10*3/uL (ref 0.0–0.1)
Basophils Relative: 0 %
Eosinophils Absolute: 0 10*3/uL (ref 0.0–0.5)
Eosinophils Relative: 0 %
HCT: 39.2 % (ref 36.0–46.0)
Hemoglobin: 11.7 g/dL — ABNORMAL LOW (ref 12.0–15.0)
Lymphocytes Relative: 4 %
Lymphs Abs: 0.8 10*3/uL (ref 0.7–4.0)
MCH: 29.8 pg (ref 26.0–34.0)
MCHC: 29.8 g/dL — ABNORMAL LOW (ref 30.0–36.0)
MCV: 99.7 fL (ref 80.0–100.0)
Monocytes Absolute: 0.8 10*3/uL (ref 0.1–1.0)
Monocytes Relative: 4 %
Neutro Abs: 19.2 10*3/uL — ABNORMAL HIGH (ref 1.7–7.7)
Neutrophils Relative %: 92 %
Platelets: 380 10*3/uL (ref 150–400)
RBC: 3.93 MIL/uL (ref 3.87–5.11)
RDW: 14.6 % (ref 11.5–15.5)
WBC: 20.9 10*3/uL — ABNORMAL HIGH (ref 4.0–10.5)
nRBC: 0 /100 WBC
nRBC: 0.1 % (ref 0.0–0.2)

## 2023-06-26 LAB — RESP PANEL BY RT-PCR (RSV, FLU A&B, COVID)  RVPGX2
Influenza A by PCR: NEGATIVE
Influenza B by PCR: NEGATIVE
Resp Syncytial Virus by PCR: NEGATIVE
SARS Coronavirus 2 by RT PCR: NEGATIVE

## 2023-06-26 LAB — BRAIN NATRIURETIC PEPTIDE: B Natriuretic Peptide: 151.2 pg/mL — ABNORMAL HIGH (ref 0.0–100.0)

## 2023-06-26 LAB — I-STAT CG4 LACTIC ACID, ED
Lactic Acid, Venous: 1.6 mmol/L (ref 0.5–1.9)
Lactic Acid, Venous: 3.1 mmol/L (ref 0.5–1.9)

## 2023-06-26 LAB — TROPONIN I (HIGH SENSITIVITY)
Troponin I (High Sensitivity): 34 ng/L — ABNORMAL HIGH (ref <18)
Troponin I (High Sensitivity): 30 ng/L — ABNORMAL HIGH (ref <18)

## 2023-06-26 LAB — PROTIME-INR
INR: 1.3 — ABNORMAL HIGH (ref 0.8–1.2)
Prothrombin Time: 16.1 seconds — ABNORMAL HIGH (ref 11.4–15.2)

## 2023-06-26 LAB — MAGNESIUM: Magnesium: 2.4 mg/dL (ref 1.7–2.4)

## 2023-06-26 MED ORDER — SODIUM CHLORIDE 0.9 % IV SOLN
500.0000 mg | INTRAVENOUS | Status: DC
  Administered 2023-06-26: 500 mg via INTRAVENOUS
  Filled 2023-06-26: qty 5

## 2023-06-26 MED ORDER — POTASSIUM CHLORIDE 10 MEQ/100ML IV SOLN
10.0000 meq | INTRAVENOUS | Status: AC
  Administered 2023-06-26 – 2023-06-27 (×3): 10 meq via INTRAVENOUS
  Filled 2023-06-26 (×3): qty 100

## 2023-06-26 MED ORDER — SODIUM CHLORIDE 0.9 % IV SOLN
2.0000 g | INTRAVENOUS | Status: DC
  Administered 2023-06-26: 2 g via INTRAVENOUS
  Filled 2023-06-26: qty 20

## 2023-06-26 MED ORDER — LACTATED RINGERS IV BOLUS (SEPSIS)
500.0000 mL | Freq: Once | INTRAVENOUS | Status: AC
  Administered 2023-06-26: 500 mL via INTRAVENOUS

## 2023-06-26 MED ORDER — SODIUM CHLORIDE 0.9 % IV SOLN: 1.0000 g | Freq: Once | INTRAVENOUS | Status: DC

## 2023-06-26 MED ORDER — SODIUM CHLORIDE 0.9 % IV SOLN
3.0000 g | Freq: Once | INTRAVENOUS | Status: AC
  Administered 2023-06-27: 3 g via INTRAVENOUS
  Filled 2023-06-26: qty 8

## 2023-06-26 MED ORDER — MAGNESIUM SULFATE 2 GM/50ML IV SOLN
2.0000 g | Freq: Once | INTRAVENOUS | Status: AC
  Administered 2023-06-26: 2 g via INTRAVENOUS
  Filled 2023-06-26: qty 50

## 2023-06-26 MED ORDER — IOHEXOL 350 MG/ML SOLN
75.0000 mL | Freq: Once | INTRAVENOUS | Status: AC | PRN
  Administered 2023-06-26: 75 mL via INTRAVENOUS

## 2023-06-26 NOTE — Sepsis Progress Note (Signed)
[facility]link following for sepsis protocol. 

## 2023-06-26 NOTE — ED Triage Notes (Addendum)
Arrives [facility] from home. Family reports to paramedics that she was eating dinner and suddenly started having difficulty breathing. Struggles with eating at baseline.   Upon EMS arrival pt found 84% room air. Tachycardic, tachypnic, hot to touch and sweating.   Per EMS pt initial presentation is baseline and always faces the left side. Hx of alzheimer's.   Upon arrival found at 82% room air with improvement to >90% on 6L .

## 2023-06-26 NOTE — ED Provider Notes (Signed)
[place] EMERGENCY DEPARTMENT AT [facility] Provider Note  CSN: [id number] Arrival date & time: 06/26/23 1957  Chief Complaint(s) Respiratory Distress  HPI Mikayla Ross is a 72 y.o. female with past medical history as below, significant for alzheimer's dementia, CVA, hld, OSA, tobacco use who presents to the ED with complaint of respiratory distress  Pt unable to contribute to history 2/2 resp distress  / dementia. Per EMS/family pt was eating dinner, suddenly became very short of breath /coughing. EMS called, pulse ox on their arrival was 80's% on RA, improved on 15L NRB, no home o2 use. No vomiting, no recent falls, mental status at baseline per ems/family  Level 5 caveat, ams/dementia  Past Medical History Past Medical History:  Diagnosis Date   Bilateral carotid artery stenosis 01/05/2018   Estrogen deficiency 02/14/2018   High cholesterol    Hyperlipidemia 10/06/2019   Major neurocognitive disorder, unclear etiology 07/31/2020   possible Alzheimer's disease   Obstructive sleep apnea    Right-sided Bell's palsy 12/27/2017   Stroke    Asymptomatic ischemic left cerebellar stroke   Patient Active Problem List   Diagnosis Date Noted   Alzheimer's dementia with behavioral disturbance (HCC) 12/27/2020   Major neurocognitive disorder, unclear etiology 10/06/2019   Stroke    Hyperlipidemia    Obstructive sleep apnea    High risk for hip fracture 02/21/2018   Tobacco abuse 02/14/2018   Estrogen deficiency 02/14/2018   Bilateral carotid artery stenosis 01/05/2018   Right-sided Bell's palsy 12/27/2017   Home Medication(s) Prior to Admission medications   Medication Sig Start Date End Date Taking? Authorizing Provider  atorvastatin (LIPITOR) 40 MG tablet Take 1 tablet (40 mg total) by mouth daily. 10/26/22  Yes Sharlene Dory, DO  buPROPion (WELLBUTRIN XL) 150 MG 24 hr tablet Take 1 tablet by mouth once daily 06/21/23  Yes Wendling, Jilda Roche, DO  memantine  (NAMENDA) 10 MG tablet Take 1 tablet (10 mg total) by mouth 2 (two) times daily. 11/04/22  Yes Sharlene Dory, DO  QUEtiapine (SEROQUEL) 25 MG tablet TAKE 1 TABLET BY MOUTH AT BEDTIME 06/21/23  Yes Wendling, Jilda Roche, DO  sertraline (ZOLOFT) 50 MG tablet Take 1 tablet (50 mg total) by mouth daily. 03/12/22  Yes Wendling, Jilda Roche, DO                                                                                                                                    Past Surgical History Past Surgical History:  Procedure Laterality Date   ABDOMINAL HYSTERECTOMY     APPENDECTOMY     TONSILLECTOMY     Family History Family History  Problem Relation Age of Onset   Renal Disease Mother    Seizures Father    Renal Disease Father    Cancer Neg Hx     Social History Social History   Tobacco Use   Smoking status: Former  Current packs/day: 0.50    Average packs/day: 0.5 packs/day for 50.0 years (25.0 ttl pk-yrs)    Types: Cigarettes   Smokeless tobacco: Never   Tobacco comments:    April 2022  Vaping Use   Vaping status: Never Used  Substance Use Topics   Alcohol use: No   Drug use: No   Allergies Patient has no known allergies.  Review of Systems Review of Systems  Unable to perform ROS: Severe respiratory distress  Respiratory:  Positive for cough and shortness of breath.     Physical Exam Vital Signs  I have reviewed the triage vital signs BP 117/68   Pulse 92   Temp 98.4 F (36.9 C)   Resp (!) 21   Ht 5\' 3"  (1.6 m)   Wt 56.7 kg   SpO2 94%   BMI 22.14 kg/m  Physical Exam Vitals and nursing note reviewed. Exam conducted with a chaperone present.  Constitutional:      General: She is in acute distress.     Appearance: She is ill-appearing and diaphoretic.     Comments: frail  HENT:     Head: Normocephalic and atraumatic.     Right Ear: External ear normal.     Left Ear: External ear normal.     Nose: Nose normal.     Mouth/Throat:      Mouth: Mucous membranes are dry.  Eyes:     General: No scleral icterus.       Right eye: No discharge.        Left eye: No discharge.  Neck:     Comments: Baseline neck rigidity  Cardiovascular:     Rate and Rhythm: Regular rhythm. Tachycardia present.     Pulses: Normal pulses.     Heart sounds: Normal heart sounds. No murmur heard. Pulmonary:     Effort: Tachypnea and respiratory distress present.     Breath sounds: Decreased breath sounds present.     Comments: Coarse breath sounds R > L Abdominal:     General: Abdomen is flat. There is no distension.     Palpations: Abdomen is soft.     Tenderness: There is no abdominal tenderness.  Musculoskeletal:     Cervical back: Rigidity present.     Right lower leg: No edema.     Left lower leg: No edema.  Skin:    General: Skin is warm.     Capillary Refill: Capillary refill takes less than 2 seconds.     Coloration: Skin is not jaundiced.  Neurological:     Mental Status: She is alert. Mental status is at baseline.     ED Results and Treatments Labs (all labs ordered are listed, but only abnormal results are displayed) Labs Reviewed  CBC WITH DIFFERENTIAL/PLATELET - Abnormal; Notable for the following components:      Result Value   WBC 20.9 (*)    Hemoglobin 11.7 (*)    MCHC 29.8 (*)    Neutro Abs 19.2 (*)    All other components within normal limits  BRAIN NATRIURETIC PEPTIDE - Abnormal; Notable for the following components:   B Natriuretic Peptide 151.2 (*)    All other components within normal limits  COMPREHENSIVE METABOLIC PANEL - Abnormal; Notable for the following components:   Sodium 156 (*)    Potassium 2.7 (*)    Chloride 112 (*)    Glucose, Bld 151 (*)    BUN 40 (*)    Creatinine, Ser 1.25 (*)  Albumin 2.3 (*)    AST 67 (*)    ALT 106 (*)    Alkaline Phosphatase 232 (*)    GFR, Estimated 46 (*)    Anion gap 19 (*)    All other components within normal limits  PROTIME-INR - Abnormal; Notable for  the following components:   Prothrombin Time 16.1 (*)    INR 1.3 (*)    All other components within normal limits  I-STAT VENOUS BLOOD GAS, ED - Abnormal; Notable for the following components:   pH, Ven 7.477 (*)    pCO2, Ven 39.3 (*)    pO2, Ven 59 (*)    Bicarbonate 29.1 (*)    Acid-Base Excess 5.0 (*)    Sodium 157 (*)    Potassium 2.7 (*)    HCT 35.0 (*)    Hemoglobin 11.9 (*)    All other components within normal limits  I-STAT CG4 LACTIC ACID, ED - Abnormal; Notable for the following components:   Lactic Acid, Venous 3.1 (*)    All other components within normal limits  TROPONIN I (HIGH SENSITIVITY) - Abnormal; Notable for the following components:   Troponin I (High Sensitivity) 34 (*)    All other components within normal limits  TROPONIN I (HIGH SENSITIVITY) - Abnormal; Notable for the following components:   Troponin I (High Sensitivity) 30 (*)    All other components within normal limits  RESP PANEL BY RT-PCR (RSV, FLU A&B, COVID)  RVPGX2  CULTURE, BLOOD (ROUTINE X 2)  CULTURE, BLOOD (ROUTINE X 2)  URINALYSIS, W/ REFLEX TO CULTURE (INFECTION SUSPECTED)  MAGNESIUM  I-STAT CG4 LACTIC ACID, ED                                                                                                                          Radiology CT Angio Chest PE W and/or Wo Contrast  Result Date: 06/26/2023 CLINICAL DATA:  Abdominal pain, difficulty breathing EXAM: CT ANGIOGRAPHY CHEST CT ABDOMEN AND PELVIS WITH CONTRAST TECHNIQUE: Multidetector CT imaging of the chest was performed using the standard protocol during bolus administration of intravenous contrast. Multiplanar CT image reconstructions and MIPs were obtained to evaluate the vascular anatomy. Multidetector CT imaging of the abdomen and pelvis was performed using the standard protocol during bolus administration of intravenous contrast. RADIATION DOSE REDUCTION: This exam was performed according to the departmental dose-optimization  program which includes automated exposure control, adjustment of the mA and/or kV according to patient size and/or use of iterative reconstruction technique. CONTRAST:  75mL OMNIPAQUE IOHEXOL 350 MG/ML SOLN COMPARISON:  06/26/2023 FINDINGS: CTA CHEST FINDINGS Cardiovascular: This is a technically adequate evaluation of the pulmonary vasculature. No filling defects or pulmonary emboli. The heart is unremarkable without pericardial effusion. No evidence of thoracic aortic aneurysm or dissection. Atherosclerosis of the aorta and coronary vasculature. Mild stenosis at the origin of the left subclavian artery approaches 50%. Mediastinum/Nodes: Subcarinal adenopathy measuring up to 12 mm in short axis. Thyroid, trachea, and esophagus  are unremarkable. Lungs/Pleura: There is a thick-walled cavitary mass within the right lower lobe measuring 5.5 x 4.1 by 4.7 cm. It is unclear whether this reflects a pulmonary abscess or cavitary neoplasm. There is complete occlusion of the right mainstem bronchus, proximal right upper lobe bronchus, bronchus intermedius, and right lower lobe bronchus. There is dense consolidation throughout the right lower lobe, greatest posteriorly. Central obstructing neoplasm cannot be excluded. No effusion or pneumothorax. Musculoskeletal: There are no acute displaced fractures. Nonspecific sclerotic focus within the right anterior fifth rib. No destructive bony lesions. Reconstructed images demonstrate no additional findings. Review of the MIP images confirms the above findings. CT ABDOMEN and PELVIS FINDINGS Hepatobiliary: No focal liver abnormality is seen. No gallstones, gallbladder wall thickening, or biliary dilatation. Pancreas: Unremarkable. No pancreatic ductal dilatation or surrounding inflammatory changes. Spleen: Normal in size without focal abnormality. Adrenals/Urinary Tract: Adrenal glands are unremarkable. Kidneys are normal, without renal calculi, focal lesion, or hydronephrosis.  Bladder is unremarkable. Stomach/Bowel: No bowel obstruction or ileus. Large amount of stool within the rectal vault consistent with fecal impaction. No bowel wall thickening or inflammatory change. Vascular/Lymphatic: Aortic atherosclerosis. No enlarged abdominal or pelvic lymph nodes. Reproductive: Status post hysterectomy. No adnexal masses. Other: No free fluid or free intraperitoneal gas. No abdominal wall hernia. Musculoskeletal: No acute or destructive bony abnormalities. Reconstructed images demonstrate no additional findings. Review of the MIP images confirms the above findings. IMPRESSION: Chest: 1. No evidence of pulmonary embolus. 2. Thick-walled cavitary masslike lesion within the right lower lobe, with associated dense right lower lobe consolidation. This could reflect neoplasm or cavitating pneumonia. Pulmonology consultation and bronchoscopy may be useful. 3. Opacification of the right mainstem bronchus, proximal right upper lobe bronchus, and bronchus intermedius and right lower lobe bronchial tree. Differential includes infection or obstructing neoplasm. Again, bronchoscopy may be useful. 4. Subcarinal adenopathy, which could be reactive or metastatic. 5.  Aortic Atherosclerosis (ICD10-I70.0). Abdomen/pelvis: 1. Fecal impaction.  No bowel obstruction or ileus. 2.  Aortic Atherosclerosis (ICD10-I70.0). Electronically Signed   By: Sharlet Salina M.D.   On: 06/26/2023 22:03   CT ABDOMEN PELVIS WO CONTRAST  Result Date: 06/26/2023 CLINICAL DATA:  Abdominal pain, difficulty breathing EXAM: CT ANGIOGRAPHY CHEST CT ABDOMEN AND PELVIS WITH CONTRAST TECHNIQUE: Multidetector CT imaging of the chest was performed using the standard protocol during bolus administration of intravenous contrast. Multiplanar CT image reconstructions and MIPs were obtained to evaluate the vascular anatomy. Multidetector CT imaging of the abdomen and pelvis was performed using the standard protocol during bolus administration  of intravenous contrast. RADIATION DOSE REDUCTION: This exam was performed according to the departmental dose-optimization program which includes automated exposure control, adjustment of the mA and/or kV according to patient size and/or use of iterative reconstruction technique. CONTRAST:  75mL OMNIPAQUE IOHEXOL 350 MG/ML SOLN COMPARISON:  06/26/2023 FINDINGS: CTA CHEST FINDINGS Cardiovascular: This is a technically adequate evaluation of the pulmonary vasculature. No filling defects or pulmonary emboli. The heart is unremarkable without pericardial effusion. No evidence of thoracic aortic aneurysm or dissection. Atherosclerosis of the aorta and coronary vasculature. Mild stenosis at the origin of the left subclavian artery approaches 50%. Mediastinum/Nodes: Subcarinal adenopathy measuring up to 12 mm in short axis. Thyroid, trachea, and esophagus are unremarkable. Lungs/Pleura: There is a thick-walled cavitary mass within the right lower lobe measuring 5.5 x 4.1 by 4.7 cm. It is unclear whether this reflects a pulmonary abscess or cavitary neoplasm. There is complete occlusion of the right mainstem bronchus, proximal right upper lobe bronchus,  bronchus intermedius, and right lower lobe bronchus. There is dense consolidation throughout the right lower lobe, greatest posteriorly. Central obstructing neoplasm cannot be excluded. No effusion or pneumothorax. Musculoskeletal: There are no acute displaced fractures. Nonspecific sclerotic focus within the right anterior fifth rib. No destructive bony lesions. Reconstructed images demonstrate no additional findings. Review of the MIP images confirms the above findings. CT ABDOMEN and PELVIS FINDINGS Hepatobiliary: No focal liver abnormality is seen. No gallstones, gallbladder wall thickening, or biliary dilatation. Pancreas: Unremarkable. No pancreatic ductal dilatation or surrounding inflammatory changes. Spleen: Normal in size without focal abnormality. Adrenals/Urinary  Tract: Adrenal glands are unremarkable. Kidneys are normal, without renal calculi, focal lesion, or hydronephrosis. Bladder is unremarkable. Stomach/Bowel: No bowel obstruction or ileus. Large amount of stool within the rectal vault consistent with fecal impaction. No bowel wall thickening or inflammatory change. Vascular/Lymphatic: Aortic atherosclerosis. No enlarged abdominal or pelvic lymph nodes. Reproductive: Status post hysterectomy. No adnexal masses. Other: No free fluid or free intraperitoneal gas. No abdominal wall hernia. Musculoskeletal: No acute or destructive bony abnormalities. Reconstructed images demonstrate no additional findings. Review of the MIP images confirms the above findings. IMPRESSION: Chest: 1. No evidence of pulmonary embolus. 2. Thick-walled cavitary masslike lesion within the right lower lobe, with associated dense right lower lobe consolidation. This could reflect neoplasm or cavitating pneumonia. Pulmonology consultation and bronchoscopy may be useful. 3. Opacification of the right mainstem bronchus, proximal right upper lobe bronchus, and bronchus intermedius and right lower lobe bronchial tree. Differential includes infection or obstructing neoplasm. Again, bronchoscopy may be useful. 4. Subcarinal adenopathy, which could be reactive or metastatic. 5.  Aortic Atherosclerosis (ICD10-I70.0). Abdomen/pelvis: 1. Fecal impaction.  No bowel obstruction or ileus. 2.  Aortic Atherosclerosis (ICD10-I70.0). Electronically Signed   By: Sharlet Salina M.D.   On: 06/26/2023 22:03   DG Chest Port 1 View  Result Date: 06/26/2023 CLINICAL DATA:  Difficulty breathing EXAM: PORTABLE CHEST 1 VIEW COMPARISON:  None Available. FINDINGS: Left lung is clear. Asymmetric right infrahilar opacity. Normal cardiac size with aortic atherosclerosis. No pneumothorax IMPRESSION: Asymmetric vague right infrahilar opacity, indeterminate for pneumonia, aspiration or mass. Suggest further assessment with chest  CT. Electronically Signed   By: Jasmine Pang M.D.   On: 06/26/2023 20:16    Pertinent labs & imaging results that were available during my care of the patient were reviewed by me and considered in my medical decision making (see MDM for details).  Medications Ordered in ED Medications  cefTRIAXone (ROCEPHIN) 2 g in sodium chloride 0.9 % 100 mL IVPB (0 g Intravenous Stopped 06/26/23 2130)  azithromycin (ZITHROMAX) 500 mg in sodium chloride 0.9 % 250 mL IVPB (500 mg Intravenous New Bag/Given 06/26/23 2207)  potassium chloride 10 mEq in 100 mL IVPB (10 mEq Intravenous New Bag/Given 06/26/23 2206)  magnesium sulfate IVPB 2 g 50 mL (has no administration in time range)  lactated ringers bolus 500 mL (0 mLs Intravenous Stopped 06/26/23 2244)  iohexol (OMNIPAQUE) 350 MG/ML injection 75 mL (75 mLs Intravenous Contrast Given 06/26/23 2143)  Procedures .Critical Care  Performed by: Sloan Leiter, DO Authorized by: Sloan Leiter, DO   Critical care provider statement:    Critical care time (minutes):  79   Critical care time was exclusive of:  Separately billable procedures and treating other patients   Critical care was necessary to treat or prevent imminent or life-threatening deterioration of the following conditions:  Sepsis and respiratory failure   Critical care was time spent personally by me on the following activities:  Development of treatment plan with patient or surrogate, discussions with consultants, evaluation of patient's response to treatment, examination of patient, ordering and review of laboratory studies, ordering and review of radiographic studies, ordering and performing treatments and interventions, pulse oximetry, re-evaluation of patient's condition, review of old charts and obtaining history from patient or surrogate   Care discussed with: admitting  provider     (including critical care time)  Medical Decision Making / ED Course    Medical Decision Making:    Willette Mikayla Ross is a 72 y.o. female with past medical history as below, significant for alzheimer's dementia, CVA, hld, OSA, tobacco use who presents to the ED with complaint of respiratory distress . The complaint involves an extensive differential diagnosis and also carries with it a high risk of complications and morbidity.  Serious etiology was considered. Ddx includes but is not limited to: In my evaluation of this patient's dyspnea my DDx includes, but is not limited to, pneumonia, pulmonary embolism, pneumothorax, pulmonary edema, metabolic acidosis, asthma, COPD, cardiac cause, anemia, anxiety, etc.    Complete initial physical exam performed, notably the patient  was acute resp distress, hypoxia, tachycardia.    Reviewed and confirmed nursing documentation for past medical history, family history, social history.  Vital signs reviewed.    Clinical Course as of 06/26/23 2330  Sat Jun 26, 2023  2010 Pulse ox 82% on room air, requiring 6LNC to maintain sat >92%; no home o2 use per EMS [SG]  2048 CXR with pna vs ?aspiration vs mass, radiology recommending CT to further differentiate. Clinical picture most fitting with PNA vs aspiration, she meets for sepsis. Start abx, code sepsis. Plan admission  [SG]  2131 Sodium(!): 156 Prior 142 [SG]  2132 BUN(!): 40 [SG]  2132 Creatinine(!): 1.25 Cr mild elev, baseline around 1 [SG]  2132 She received 1L fluid with EMS [SG]  2226 CTPE/CTAP: "IMPRESSION: Chest:  1. No evidence of pulmonary embolus. 2. Thick-walled cavitary masslike lesion within the right lower lobe, with associated dense right lower lobe consolidation. This could reflect neoplasm or cavitating pneumonia. Pulmonology consultation and bronchoscopy may be useful. 3. Opacification of the right mainstem bronchus, proximal right upper lobe bronchus, and bronchus  intermedius and right lower lobe bronchial tree. Differential includes infection or obstructing neoplasm. Again, bronchoscopy may be useful. 4. Subcarinal adenopathy, which could be reactive or metastatic. 5.  Aortic Atherosclerosis (ICD10-I70.0).  Abdomen/pelvis:  1. Fecal impaction.  No bowel obstruction or ileus. 2.  Aortic Atherosclerosis (ICD10-I70.0).   Electronically Signed   By: Sharlet Salina M.D." [SG]  2318 Spoke with Dr Katrinka Blazing PCCM, will see in consult.  [SG]    Clinical Course User Index [SG] Tanda Rockers A, DO   Pt here with respiratory distress, requiring 6LNC, no home o2 requirement Code Sepsis 2/2 pna > WBC 20, tachycardia, tachypnea, hypoxia Initially given rocephin/azithro LA stable, no septic shock, HDS  CTPE concerning for possible cavitary pna/abscess/neoplasm Consulted PCCM who will see in consult Dr Katrinka Blazing Switch  to unasyn Cultures sent previously K is low, replace IV, check mag Trop mild elev, favor demand ischemia 2/2 sepsis/hypoxia  Continue [place] Pt to require admission            Additional history obtained: -Additional history obtained from ems -External records from outside source obtained and reviewed including: Chart review including previous notes, labs, imaging, consultation notes including primary care documentation, home meds , prior labs/imaging    Lab Tests: -I ordered, reviewed, and interpreted labs.   The pertinent results include:   Labs Reviewed  CBC WITH DIFFERENTIAL/PLATELET - Abnormal; Notable for the following components:      Result Value   WBC 20.9 (*)    Hemoglobin 11.7 (*)    MCHC 29.8 (*)    Neutro Abs 19.2 (*)    All other components within normal limits  BRAIN NATRIURETIC PEPTIDE - Abnormal; Notable for the following components:   B Natriuretic Peptide 151.2 (*)    All other components within normal limits  COMPREHENSIVE METABOLIC PANEL - Abnormal; Notable for the following components:   Sodium 156 (*)     Potassium 2.7 (*)    Chloride 112 (*)    Glucose, Bld 151 (*)    BUN 40 (*)    Creatinine, Ser 1.25 (*)    Albumin 2.3 (*)    AST 67 (*)    ALT 106 (*)    Alkaline Phosphatase 232 (*)    GFR, Estimated 46 (*)    Anion gap 19 (*)    All other components within normal limits  PROTIME-INR - Abnormal; Notable for the following components:   Prothrombin Time 16.1 (*)    INR 1.3 (*)    All other components within normal limits  I-STAT VENOUS BLOOD GAS, ED - Abnormal; Notable for the following components:   pH, Ven 7.477 (*)    pCO2, Ven 39.3 (*)    pO2, Ven 59 (*)    Bicarbonate 29.1 (*)    Acid-Base Excess 5.0 (*)    Sodium 157 (*)    Potassium 2.7 (*)    HCT 35.0 (*)    Hemoglobin 11.9 (*)    All other components within normal limits  I-STAT CG4 LACTIC ACID, ED - Abnormal; Notable for the following components:   Lactic Acid, Venous 3.1 (*)    All other components within normal limits  TROPONIN I (HIGH SENSITIVITY) - Abnormal; Notable for the following components:   Troponin I (High Sensitivity) 34 (*)    All other components within normal limits  TROPONIN I (HIGH SENSITIVITY) - Abnormal; Notable for the following components:   Troponin I (High Sensitivity) 30 (*)    All other components within normal limits  RESP PANEL BY RT-PCR (RSV, FLU A&B, COVID)  RVPGX2  CULTURE, BLOOD (ROUTINE X 2)  CULTURE, BLOOD (ROUTINE X 2)  URINALYSIS, W/ REFLEX TO CULTURE (INFECTION SUSPECTED)  MAGNESIUM  I-STAT CG4 LACTIC ACID, ED    Notable for wbc ++ pH okay  EKG   EKG Interpretation Date/Time:  Saturday June 26 2023 20:06:59 EDT Ventricular Rate:  111 PR Interval:  128 QRS Duration:  87 QT Interval:  322 QTC Calculation: 438 R Axis:   48  Text Interpretation: Sinus tachycardia Probable left atrial enlargement Nonspecific repol abnormality, diffuse leads No old tracing to compare Confirmed by Dione Booze ([id number]) on 06/26/2023 11:02:01 PM         Imaging Studies ordered: I  ordered imaging studies including CXR CTPE I independently  visualized the following imaging with scope of interpretation limited to determining acute life threatening conditions related to emergency care; findings noted above, significant for CXR with pna , CTPE cavitary pna/neoplasm I independently visualized and interpreted imaging. I agree with the radiologist interpretation   Medicines ordered and prescription drug management: Meds ordered this encounter  Medications   DISCONTD: cefTRIAXone (ROCEPHIN) 1 g in sodium chloride 0.9 % 100 mL IVPB    Order Specific Question:   Antibiotic Indication:    Answer:   CAP   lactated ringers bolus 500 mL    Order Specific Question:   Reason 30 mL/kg dose is not being ordered    Answer:   Fluid given earlier or by EMS - completing remainder of 30 mL/kg by TBW   cefTRIAXone (ROCEPHIN) 2 g in sodium chloride 0.9 % 100 mL IVPB    Order Specific Question:   Antibiotic Indication:    Answer:   CAP   azithromycin (ZITHROMAX) 500 mg in sodium chloride 0.9 % 250 mL IVPB    Order Specific Question:   Antibiotic Indication:    Answer:   CAP   potassium chloride 10 mEq in 100 mL IVPB   iohexol (OMNIPAQUE) 350 MG/ML injection 75 mL   magnesium sulfate IVPB 2 g 50 mL    -I have reviewed the patients home medicines and have made adjustments as needed   Consultations Obtained: na   Cardiac Monitoring: The patient was maintained on a cardiac monitor.  I personally viewed and interpreted the cardiac monitored which showed an underlying rhythm of: sinus tachy  Social Determinants of Health:  Diagnosis or treatment significantly limited by social determinants of health: former smoker, dementia   Reevaluation: After the interventions noted above, I reevaluated the patient and found that they have improved  Co morbidities that complicate the patient evaluation  Past Medical History:  Diagnosis Date   Bilateral carotid artery stenosis 01/05/2018    Estrogen deficiency 02/14/2018   High cholesterol    Hyperlipidemia 10/06/2019   Major neurocognitive disorder, unclear etiology 07/31/2020   possible Alzheimer's disease   Obstructive sleep apnea    Right-sided Bell's palsy 12/27/2017   Stroke    Asymptomatic ischemic left cerebellar stroke      Dispostion: Disposition decision including need for hospitalization was considered, and patient admitted to the hospital.    Final Clinical Impression(s) / ED Diagnoses Final diagnoses:  Acute respiratory failure with hypoxia (HCC)  Sepsis, due to unspecified organism, unspecified whether acute organ dysfunction present [facility])  Community acquired pneumonia, unspecified laterality  Hypokalemia     This chart was dictated using voice recognition software.  Despite best efforts to proofread,  errors can occur which can change the documentation meaning.    Sloan Leiter, DO 06/26/23 2330

## 2023-06-27 ENCOUNTER — Encounter (HOSPITAL_COMMUNITY): Payer: Self-pay | Admitting: Internal Medicine

## 2023-06-27 DIAGNOSIS — J851 Abscess of lung with pneumonia: Secondary | ICD-10-CM | POA: Diagnosis present

## 2023-06-27 DIAGNOSIS — J690 Pneumonitis due to inhalation of food and vomit: Secondary | ICD-10-CM | POA: Diagnosis not present

## 2023-06-27 DIAGNOSIS — E860 Dehydration: Secondary | ICD-10-CM | POA: Diagnosis present

## 2023-06-27 DIAGNOSIS — E870 Hyperosmolality and hypernatremia: Secondary | ICD-10-CM | POA: Diagnosis present

## 2023-06-27 DIAGNOSIS — J984 Other disorders of lung: Secondary | ICD-10-CM

## 2023-06-27 DIAGNOSIS — E876 Hypokalemia: Secondary | ICD-10-CM | POA: Diagnosis present

## 2023-06-27 DIAGNOSIS — A419 Sepsis, unspecified organism: Secondary | ICD-10-CM | POA: Diagnosis present

## 2023-06-27 DIAGNOSIS — R7401 Elevation of levels of liver transaminase levels: Secondary | ICD-10-CM | POA: Diagnosis present

## 2023-06-27 DIAGNOSIS — J9601 Acute respiratory failure with hypoxia: Secondary | ICD-10-CM | POA: Diagnosis not present

## 2023-06-27 DIAGNOSIS — N179 Acute kidney failure, unspecified: Secondary | ICD-10-CM | POA: Diagnosis present

## 2023-06-27 LAB — PROTIME-INR
INR: 1.3 — ABNORMAL HIGH (ref 0.8–1.2)
Prothrombin Time: 16.6 seconds — ABNORMAL HIGH (ref 11.4–15.2)

## 2023-06-27 LAB — CBC WITH DIFFERENTIAL/PLATELET
Abs Immature Granulocytes: 0 10*3/uL (ref 0.00–0.07)
Basophils Absolute: 0 10*3/uL (ref 0.0–0.1)
Basophils Relative: 0 %
Eosinophils Absolute: 0 10*3/uL (ref 0.0–0.5)
Eosinophils Relative: 0 %
HCT: 34.4 % — ABNORMAL LOW (ref 36.0–46.0)
Hemoglobin: 10.4 g/dL — ABNORMAL LOW (ref 12.0–15.0)
Lymphocytes Relative: 1 %
Lymphs Abs: 0.3 10*3/uL — ABNORMAL LOW (ref 0.7–4.0)
MCH: 30.1 pg (ref 26.0–34.0)
MCHC: 30.2 g/dL (ref 30.0–36.0)
MCV: 99.4 fL (ref 80.0–100.0)
Monocytes Absolute: 0.3 10*3/uL (ref 0.1–1.0)
Monocytes Relative: 1 %
Neutro Abs: 28.4 10*3/uL — ABNORMAL HIGH (ref 1.7–7.7)
Neutrophils Relative %: 98 %
Platelets: 336 10*3/uL (ref 150–400)
RBC: 3.46 MIL/uL — ABNORMAL LOW (ref 3.87–5.11)
RDW: 14.8 % (ref 11.5–15.5)
WBC: 29 10*3/uL — ABNORMAL HIGH (ref 4.0–10.5)
nRBC: 0 % (ref 0.0–0.2)
nRBC: 1 /100 WBC — ABNORMAL HIGH

## 2023-06-27 LAB — COMPREHENSIVE METABOLIC PANEL
ALT: 109 U/L — ABNORMAL HIGH (ref 0–44)
AST: 107 U/L — ABNORMAL HIGH (ref 15–41)
Albumin: 2.1 g/dL — ABNORMAL LOW (ref 3.5–5.0)
Alkaline Phosphatase: 199 U/L — ABNORMAL HIGH (ref 38–126)
Anion gap: 11 (ref 5–15)
BUN: 38 mg/dL — ABNORMAL HIGH (ref 8–23)
CO2: 26 mmol/L (ref 22–32)
Calcium: 9 mg/dL (ref 8.9–10.3)
Chloride: 114 mmol/L — ABNORMAL HIGH (ref 98–111)
Creatinine, Ser: 1.17 mg/dL — ABNORMAL HIGH (ref 0.44–1.00)
GFR, Estimated: 50 mL/min — ABNORMAL LOW (ref >60)
Glucose, Bld: 138 mg/dL — ABNORMAL HIGH (ref 70–99)
Potassium: 3.3 mmol/L — ABNORMAL LOW (ref 3.5–5.1)
Sodium: 151 mmol/L — ABNORMAL HIGH (ref 135–145)
Total Bilirubin: 0.7 mg/dL (ref 0.3–1.2)
Total Protein: 5.9 g/dL — ABNORMAL LOW (ref 6.5–8.1)

## 2023-06-27 LAB — BASIC METABOLIC PANEL
Anion gap: 10 (ref 5–15)
BUN: 37 mg/dL — ABNORMAL HIGH (ref 8–23)
CO2: 26 mmol/L (ref 22–32)
Calcium: 8.7 mg/dL — ABNORMAL LOW (ref 8.9–10.3)
Chloride: 113 mmol/L — ABNORMAL HIGH (ref 98–111)
Creatinine, Ser: 0.94 mg/dL (ref 0.44–1.00)
GFR, Estimated: >60 mL/min (ref >60)
Glucose, Bld: 156 mg/dL — ABNORMAL HIGH (ref 70–99)
Potassium: 3 mmol/L — ABNORMAL LOW (ref 3.5–5.1)
Sodium: 149 mmol/L — ABNORMAL HIGH (ref 135–145)

## 2023-06-27 LAB — RAPID URINE DRUG SCREEN, HOSP PERFORMED
Amphetamines: NOT DETECTED
Barbiturates: NOT DETECTED
Benzodiazepines: NOT DETECTED
Cocaine: NOT DETECTED
Opiates: NOT DETECTED
Tetrahydrocannabinol: NOT DETECTED

## 2023-06-27 LAB — URINALYSIS, W/ REFLEX TO CULTURE (INFECTION SUSPECTED)
Bilirubin Urine: NEGATIVE
Glucose, UA: NEGATIVE mg/dL
Hgb urine dipstick: NEGATIVE
Ketones, ur: NEGATIVE mg/dL
Leukocytes,Ua: NEGATIVE
Nitrite: NEGATIVE
Protein, ur: 100 mg/dL — AB
Specific Gravity, Urine: 1.01 (ref 1.005–1.030)
pH: 5.5 (ref 5.0–8.0)

## 2023-06-27 LAB — CREATININE, URINE, RANDOM: Creatinine, Urine: 127 mg/dL

## 2023-06-27 LAB — OSMOLALITY, URINE: Osmolality, Ur: 641 mOsm/kg (ref 300–900)

## 2023-06-27 LAB — MAGNESIUM: Magnesium: 3.1 mg/dL — ABNORMAL HIGH (ref 1.7–2.4)

## 2023-06-27 LAB — PROCALCITONIN: Procalcitonin: 1.53 ng/mL

## 2023-06-27 LAB — GAMMA GT: GGT: 105 U/L — ABNORMAL HIGH (ref 7–50)

## 2023-06-27 LAB — SODIUM, URINE, RANDOM: Sodium, Ur: <10 mmol/L

## 2023-06-27 MED ORDER — HYDROMORPHONE HCL 1 MG/ML IJ SOLN: 0.5000 mg | INTRAMUSCULAR | Status: DC | PRN

## 2023-06-27 MED ORDER — GLYCOPYRROLATE 0.2 MG/ML IJ SOLN: 0.2000 mg | INTRAMUSCULAR | Status: DC | PRN

## 2023-06-27 MED ORDER — SODIUM CHLORIDE 3 % IN NEBU
4.0000 mL | INHALATION_SOLUTION | Freq: Two times a day (BID) | RESPIRATORY_TRACT | Status: DC
  Administered 2023-06-27 (×2): 4 mL via RESPIRATORY_TRACT
  Filled 2023-06-27 (×2): qty 4

## 2023-06-27 MED ORDER — DEXTROSE 5 % IV SOLN: INTRAVENOUS | Status: DC

## 2023-06-27 MED ORDER — ACETAMINOPHEN 325 MG PO TABS: 650.0000 mg | ORAL_TABLET | Freq: Four times a day (QID) | ORAL | Status: DC | PRN

## 2023-06-27 MED ORDER — LACTATED RINGERS IV BOLUS
500.0000 mL | Freq: Once | INTRAVENOUS | Status: AC
  Administered 2023-06-27: 500 mL via INTRAVENOUS

## 2023-06-27 MED ORDER — LORAZEPAM 2 MG/ML PO CONC: 1.0000 mg | ORAL | Status: DC | PRN

## 2023-06-27 MED ORDER — ONDANSETRON 4 MG PO TBDP: 4.0000 mg | ORAL_TABLET | Freq: Four times a day (QID) | ORAL | Status: DC | PRN

## 2023-06-27 MED ORDER — HALOPERIDOL 0.5 MG PO TABS: 0.5000 mg | ORAL_TABLET | ORAL | Status: DC | PRN

## 2023-06-27 MED ORDER — ONDANSETRON HCL 4 MG/2ML IJ SOLN: 4.0000 mg | Freq: Four times a day (QID) | INTRAMUSCULAR | Status: DC | PRN

## 2023-06-27 MED ORDER — MORPHINE SULFATE (CONCENTRATE) 10 MG/0.5ML PO SOLN: 5.0000 mg | ORAL | Status: DC | PRN

## 2023-06-27 MED ORDER — ACETAMINOPHEN 650 MG RE SUPP: 650.0000 mg | Freq: Four times a day (QID) | RECTAL | Status: DC | PRN

## 2023-06-27 MED ORDER — ACETAMINOPHEN 650 MG RE SUPP
650.0000 mg | Freq: Four times a day (QID) | RECTAL | Status: DC | PRN
  Administered 2023-06-27: 650 mg via RECTAL
  Filled 2023-06-27: qty 1

## 2023-06-27 MED ORDER — FOOD THICKENER (SIMPLYTHICK): 1.0000 | ORAL | Status: DC | PRN

## 2023-06-27 MED ORDER — LORAZEPAM 1 MG PO TABS: 1.0000 mg | ORAL_TABLET | ORAL | Status: DC | PRN

## 2023-06-27 MED ORDER — GLYCOPYRROLATE 1 MG PO TABS: 1.0000 mg | ORAL_TABLET | ORAL | Status: DC | PRN

## 2023-06-27 MED ORDER — OFLOXACIN 0.3 % OP SOLN
1.0000 [drp] | Freq: Four times a day (QID) | OPHTHALMIC | Status: DC
  Administered 2023-06-27 – 2023-06-28 (×5): 1 [drp] via OPHTHALMIC
  Filled 2023-06-27 (×2): qty 5

## 2023-06-27 MED ORDER — HALOPERIDOL LACTATE 2 MG/ML PO CONC: 0.5000 mg | ORAL | Status: DC | PRN

## 2023-06-27 MED ORDER — ENSURE ENLIVE PO LIQD
237.0000 mL | Freq: Two times a day (BID) | ORAL | Status: DC
  Administered 2023-06-28: 237 mL via ORAL

## 2023-06-27 MED ORDER — LORAZEPAM 2 MG/ML IJ SOLN: 1.0000 mg | INTRAMUSCULAR | Status: DC | PRN

## 2023-06-27 MED ORDER — MAGIC MOUTHWASH: 15.0000 mL | Freq: Four times a day (QID) | ORAL | Status: DC | PRN

## 2023-06-27 MED ORDER — SODIUM CHLORIDE 0.9 % IV SOLN
3.0000 g | Freq: Three times a day (TID) | INTRAVENOUS | Status: DC
  Administered 2023-06-27: 3 g via INTRAVENOUS
  Filled 2023-06-27 (×2): qty 8

## 2023-06-27 MED ORDER — HALOPERIDOL LACTATE 5 MG/ML IJ SOLN: 2.0000 mg | INTRAMUSCULAR | Status: DC | PRN

## 2023-06-27 MED ORDER — DEXTROSE IN LACTATED RINGERS 5 % IV SOLN: INTRAVENOUS | Status: DC

## 2023-06-27 NOTE — Progress Notes (Signed)
Pharmacy Antibiotic Note  Mikayla Ross is a 72 y.o. female admitted on 06/26/2023 with  aspiration pneumonia/lung abscess .  Pharmacy has been consulted for Unasyn dosing. WBC elevated. Scr 1.25.   Plan: Unasyn 3g IV q8h Trend WBC, temp, renal function  F/U infectious work-up  Height: 5\' 3"  (160 cm) Weight: 56.7 kg (125 lb) IBW/kg (Calculated) : 52.4  Temp (24hrs), Avg:98.4 F (36.9 C), Min:98.4 F (36.9 C), Max:98.4 F (36.9 C)  Recent Labs  Lab 06/26/23 2010 06/26/23 2207 06/26/23 2217  WBC 20.9*  --   --   CREATININE 1.25*  --   --   LATICACIDVEN  --  3.1* 1.6    Estimated Creatinine Clearance: 34.6 mL/min (A) (by C-G formula based on SCr of 1.25 mg/dL (H)).    No Known Allergies  Abran Duke, PharmD, BCPS Clinical Pharmacist Phone: [phone]

## 2023-06-27 NOTE — ED Notes (Signed)
ED TO INPATIENT HANDOFF REPORT  ED Nurse Name and Phone #: Henriette Combs [phone]  S Name/Age/Gender Mikayla Ross 72 y.o. female Room/Bed: 033C/033C  Code Status   Code Status: DNR  Home/SNF/Other Unknown  Patient oriented to: self Is this baseline? Yes   Triage Complete: Triage complete  Chief Complaint Aspiration pneumonia (HCC) [J69.0] Pneumonia with lung abscess [facility]) [J85.1]  Triage Note Arrives [facility] from home. Family reports to paramedics that she was eating dinner and suddenly started having difficulty breathing. Struggles with eating at baseline.   Upon EMS arrival pt found 84% room air. Tachycardic, tachypnic, hot to touch and sweating.   Per EMS pt initial presentation is baseline and always faces the left side. Hx of alzheimer's.   Upon arrival found at 82% room air with improvement to >90% on 6L [place].    Allergies No Known Allergies  Level of Care/Admitting Diagnosis ED Disposition     ED Disposition  Admit   Condition  --   Comment  Hospital Area: [facility] [[id number]]  Level of Care: Med-Surg [16]  May admit patient to [facility] or [facility] if equivalent level of care is available:: No  Covid Evaluation: Asymptomatic - no recent exposure (last 10 days) testing not required  Diagnosis: Pneumonia with lung abscess [facility]) [[id number]]  Admitting Physician: Rolly Salter [[id number]]  Attending Physician: Rolly Salter [[id number]]  Certification:: I certify there are rare and unusual circumstances requiring inpatient admission          B Medical/Surgery History Past Medical History:  Diagnosis Date   Bilateral carotid artery stenosis 01/05/2018   Estrogen deficiency 02/14/2018   High cholesterol    Hyperlipidemia 10/06/2019   Major neurocognitive disorder, unclear etiology 07/31/2020   possible Alzheimer's disease   Obstructive sleep apnea    Right-sided Bell's palsy 12/27/2017   Stroke    Asymptomatic ischemic left cerebellar  stroke   Past Surgical History:  Procedure Laterality Date   ABDOMINAL HYSTERECTOMY     APPENDECTOMY     TONSILLECTOMY       A IV Location/Drains/Wounds Patient Lines/Drains/Airways Status     Active Line/Drains/Airways     Name Placement date Placement time Site Days   Peripheral IV 06/26/23 18 G 1.16" Anterior;Right Forearm 06/26/23  --  Forearm  1   Peripheral IV 06/26/23 18 G 1.16" Left Forearm 06/26/23  2040  Forearm  1            Intake/Output Last 24 hours  Intake/Output Summary (Last 24 hours) at 06/27/2023 1121 Last data filed at 06/27/2023 0349 Gross per 24 hour  Intake 1834.77 ml  Output 10 ml  Net 1824.77 ml    Labs/Imaging Results for orders placed or performed during the hospital encounter of 06/26/23 (from the past 48 hour(s))  CBC with Differential     Status: Abnormal   Collection Time: 06/26/23  8:10 PM  Result Value Ref Range   WBC 20.9 (H) 4.0 - 10.5 K/uL   RBC 3.93 3.87 - 5.11 MIL/uL   Hemoglobin 11.7 (L) 12.0 - 15.0 g/dL   HCT 66.4 40.3 - 47.4 %   MCV 99.7 80.0 - 100.0 fL   MCH 29.8 26.0 - 34.0 pg   MCHC 29.8 (L) 30.0 - 36.0 g/dL   RDW 25.9 56.3 - 87.5 %   Platelets 380 150 - 400 K/uL   nRBC 0.1 0.0 - 0.2 %   Neutrophils Relative % 92 %   Neutro  Abs 19.2 (H) 1.7 - 7.7 K/uL   Lymphocytes Relative 4 %   Lymphs Abs 0.8 0.7 - 4.0 K/uL   Monocytes Relative 4 %   Monocytes Absolute 0.8 0.1 - 1.0 K/uL   Eosinophils Relative 0 %   Eosinophils Absolute 0.0 0.0 - 0.5 K/uL   Basophils Relative 0 %   Basophils Absolute 0.0 0.0 - 0.1 K/uL   nRBC 0 0 /100 WBC   Abs Immature Granulocytes 0.00 0.00 - 0.07 K/uL    Comment: Performed at [facility], [address], [place], [place] [address]  Brain natriuretic peptide     Status: Abnormal   Collection Time: 06/26/23  8:10 PM  Result Value Ref Range   B Natriuretic Peptide 151.2 (H) 0.0 - 100.0 pg/mL    Comment: Performed at [facility], [address], [place], [place] [address]   Comprehensive metabolic panel     Status: Abnormal   Collection Time: 06/26/23  8:10 PM  Result Value Ref Range   Sodium 156 (H) 135 - 145 mmol/L   Potassium 2.7 (LL) 3.5 - 5.1 mmol/L    Comment: CRITICAL RESULT CALLED TO, READ BACK BY AND VERIFIED WITH Angela Adam, RN.  2113 06/26/23. LPAIT   Chloride 112 (H) 98 - 111 mmol/L   CO2 25 22 - 32 mmol/L   Glucose, Bld 151 (H) 70 - 99 mg/dL    Comment: Glucose reference range applies only to samples taken after fasting for at least 8 hours.   BUN 40 (H) 8 - 23 mg/dL   Creatinine, Ser 1.02 (H) 0.44 - 1.00 mg/dL   Calcium 9.5 8.9 - 72.5 mg/dL   Total Protein 6.5 6.5 - 8.1 g/dL   Albumin 2.3 (L) 3.5 - 5.0 g/dL   AST 67 (H) 15 - 41 U/L   ALT 106 (H) 0 - 44 U/L   Alkaline Phosphatase 232 (H) 38 - 126 U/L   Total Bilirubin 0.9 0.3 - 1.2 mg/dL   GFR, Estimated 46 (L) >60 mL/min    Comment: (NOTE) Calculated using the CKD-EPI Creatinine Equation (2021)    Anion gap 19 (H) 5 - 15    Comment: Performed at [facility], [address], [place], [place] [address]  Troponin I (High Sensitivity)     Status: Abnormal   Collection Time: 06/26/23  8:10 PM  Result Value Ref Range   Troponin I (High Sensitivity) 34 (H) <18 ng/L    Comment: (NOTE) Elevated high sensitivity troponin I (hsTnI) values and significant  changes across serial measurements may suggest ACS but many other  chronic and acute conditions are known to elevate hsTnI results.  Refer to the "Links" section for chest pain algorithms and additional  guidance. Performed at [facility], [address] [place], [place] [address]   Resp panel by RT-PCR (RSV, Flu A&B, Covid) Anterior Nasal Swab     Status: None   Collection Time: 06/26/23  8:10 PM   Specimen: Anterior Nasal Swab  Result Value Ref Range   SARS Coronavirus 2 by RT PCR NEGATIVE NEGATIVE   Influenza A by PCR NEGATIVE NEGATIVE   Influenza B by PCR NEGATIVE NEGATIVE    Comment: (NOTE) The Xpert Xpress  SARS-CoV-2/FLU/RSV plus assay is intended as an aid in the diagnosis of influenza from Nasopharyngeal swab specimens and should not be used as a sole basis for treatment. Nasal washings and aspirates are unacceptable for Xpert Xpress SARS-CoV-2/FLU/RSV testing.  Fact Sheet for Patients: BloggerCourse.com  Fact Sheet for Healthcare Providers: SeriousBroker.it  This test is not yet approved or cleared by the [place] FDA and has been authorized for detection and/or diagnosis of SARS-CoV-2 by FDA under an Emergency Use Authorization (EUA). This EUA will remain in effect (meaning this test can be used) for the duration of the COVID-19 declaration under Section 564(b)(1) of the Act, 21 U.S.C. section 360bbb-3(b)(1), unless the authorization is terminated or revoked.     Resp Syncytial Virus by PCR NEGATIVE NEGATIVE    Comment: (NOTE) Fact Sheet for Patients: BloggerCourse.com  Fact Sheet for Healthcare Providers: SeriousBroker.it  This test is not yet approved or cleared by the [place] FDA and has been authorized for detection and/or diagnosis of SARS-CoV-2 by FDA under an Emergency Use Authorization (EUA). This EUA will remain in effect (meaning this test can be used) for the duration of the COVID-19 declaration under Section 564(b)(1) of the Act, 21 U.S.C. section 360bbb-3(b)(1), unless the authorization is terminated or revoked.  Performed at [facility], [address], [place], [place] [address]   I-Stat venous blood gas, [facility], [facility], [facility])     Status: Abnormal   Collection Time: 06/26/23  8:17 PM  Result Value Ref Range   pH, Ven 7.477 (H) 7.25 - 7.43   pCO2, Ven 39.3 (L) 44 - 60 mmHg   pO2, Ven 59 (H) 32 - 45 mmHg   Bicarbonate 29.1 (H) 20.0 - 28.0 mmol/L   TCO2 30 22 - 32 mmol/L   O2 Saturation 92 %   Acid-Base Excess 5.0 (H) 0.0 - 2.0 mmol/L   Sodium  157 (H) 135 - 145 mmol/L   Potassium 2.7 (LL) 3.5 - 5.1 mmol/L   Calcium, Ion 1.21 1.15 - 1.40 mmol/L   HCT 35.0 (L) 36.0 - 46.0 %   Hemoglobin 11.9 (L) 12.0 - 15.0 g/dL   Patient temperature 60.4 C    Sample type VENOUS    Comment NOTIFIED PHYSICIAN   Blood culture (routine x 2)     Status: None (Preliminary result)   Collection Time: 06/26/23  8:45 PM   Specimen: BLOOD  Result Value Ref Range   Specimen Description BLOOD BLOOD RIGHT ARM    Special Requests      BOTTLES DRAWN AEROBIC AND ANAEROBIC Blood Culture results may not be optimal due to an excessive volume of blood received in culture bottles   Culture      NO GROWTH < 12 HOURS Performed at [facility], [address], [place], [place] [address]    Report Status PENDING   Protime-INR     Status: Abnormal   Collection Time: 06/26/23  8:47 PM  Result Value Ref Range   Prothrombin Time 16.1 (H) 11.4 - 15.2 seconds   INR 1.3 (H) 0.8 - 1.2    Comment: (NOTE) INR goal varies based on device and disease states. Performed at [facility], [address], [place], [place] [address]   Blood culture (routine x 2)     Status: None (Preliminary result)   Collection Time: 06/26/23  8:47 PM   Specimen: BLOOD  Result Value Ref Range   Specimen Description BLOOD BLOOD LEFT ARM    Special Requests      BOTTLES DRAWN AEROBIC AND ANAEROBIC Blood Culture adequate volume   Culture      NO GROWTH < 12 HOURS Performed at [facility], [address], [place],  [place] [address]    Report Status PENDING   I-Stat Lactic Acid, ED     Status: Abnormal   Collection Time: 06/26/23 10:07 PM  Result Value Ref Range   Lactic Acid, Venous 3.1 (HH) 0.5 - 1.9 mmol/L   Comment NOTIFIED PHYSICIAN   Troponin I (High Sensitivity)     Status: Abnormal   Collection Time: 06/26/23 10:08 PM  Result Value Ref Range   Troponin I (High Sensitivity) 30 (H) <18 ng/L    Comment: (NOTE) Elevated high sensitivity troponin I (hsTnI) values  and significant  changes across serial measurements may suggest ACS but many other  chronic and acute conditions are known to elevate hsTnI results.  Refer to the "Links" section for chest pain algorithms and additional  guidance. Performed at [facility], [address] [place], [place] [address]   Magnesium     Status: None   Collection Time: 06/26/23 10:08 PM  Result Value Ref Range   Magnesium 2.4 1.7 - 2.4 mg/dL    Comment: Performed at [facility], [address] [place], [place] [address]  I-Stat Lactic Acid, ED     Status: None   Collection Time: 06/26/23 10:17 PM  Result Value Ref Range   Lactic Acid, Venous 1.6 0.5 - 1.9 mmol/L  Urinalysis, w/ Reflex to Culture (Infection Suspected) -Urine, Clean Catch     Status: Abnormal   Collection Time: 06/27/23  1:13 AM  Result Value Ref Range   Specimen Source URINE, CLEAN CATCH    Color, Urine YELLOW YELLOW   APPearance HAZY (A) CLEAR   Specific Gravity, Urine 1.010 1.005 - 1.030   pH 5.5 5.0 - 8.0   Glucose, UA NEGATIVE NEGATIVE mg/dL   Hgb urine dipstick NEGATIVE NEGATIVE   Bilirubin Urine NEGATIVE NEGATIVE   Ketones, ur NEGATIVE NEGATIVE mg/dL   Protein, ur 914 (A) NEGATIVE mg/dL   Nitrite NEGATIVE NEGATIVE   Leukocytes,Ua NEGATIVE NEGATIVE   Squamous Epithelial / HPF 0-5 0 - 5 /HPF   WBC, UA 0-5 0 - 5 WBC/hpf    Comment: Reflex urine culture not performed if WBC <=10, OR if Squamous epithelial cells >5. If Squamous epithelial cells >5, suggest recollection.   RBC / HPF 0-5 0 - 5 RBC/hpf   Bacteria, UA FEW (A) NONE SEEN   Hyaline Casts, UA PRESENT    Granular Casts, UA PRESENT     Comment: Performed at [facility], [address] [place], [place] [address]  Rapid urine drug screen (hospital performed)     Status: None   Collection Time: 06/27/23  1:13 AM  Result Value Ref Range   Opiates NONE DETECTED NONE DETECTED   Cocaine NONE DETECTED NONE DETECTED   Benzodiazepines NONE DETECTED NONE  DETECTED   Amphetamines NONE DETECTED NONE DETECTED   Tetrahydrocannabinol NONE DETECTED NONE DETECTED   Barbiturates NONE DETECTED NONE DETECTED    Comment: (NOTE) DRUG SCREEN FOR MEDICAL PURPOSES ONLY.  IF CONFIRMATION IS NEEDED FOR ANY PURPOSE, NOTIFY LAB WITHIN 5 DAYS.  LOWEST DETECTABLE LIMITS FOR URINE DRUG SCREEN Drug Class                     Cutoff (ng/mL) Amphetamine and metabolites    1000 Barbiturate and metabolites    200 Benzodiazepine                 200 Opiates and metabolites        300 Cocaine and  metabolites        300 THC                            50 Performed at [facility] Lab, [address] [place], [place] [address]   Sodium, urine, random     Status: None   Collection Time: 06/27/23  1:13 AM  Result Value Ref Range   Sodium, Ur <10 mmol/L    Comment: Performed at [facility], [address] [place], [place] [address]  Creatinine, urine, random     Status: None   Collection Time: 06/27/23  1:13 AM  Result Value Ref Range   Creatinine, Urine 127 mg/dL    Comment: Performed at [facility], [address] [place], [place] [address]  Osmolality, urine     Status: None   Collection Time: 06/27/23  1:13 AM  Result Value Ref Range   Osmolality, Ur 641 300 - 900 mOsm/kg    Comment: Performed at [facility], [address] [place], [place] [address]  CBC with Differential/Platelet     Status: Abnormal   Collection Time: 06/27/23  2:45 AM  Result Value Ref Range   WBC 29.0 (H) 4.0 - 10.5 K/uL   RBC 3.46 (L) 3.87 - 5.11 MIL/uL   Hemoglobin 10.4 (L) 12.0 - 15.0 g/dL   HCT 29.5 (L) 62.1 - 30.8 %   MCV 99.4 80.0 - 100.0 fL   MCH 30.1 26.0 - 34.0 pg   MCHC 30.2 30.0 - 36.0 g/dL   RDW 65.7 84.6 - 96.2 %   Platelets 336 150 - 400 K/uL   nRBC 0.0 0.0 - 0.2 %   Neutrophils Relative % 98 %   Neutro Abs 28.4 (H) 1.7 - 7.7 K/uL   Lymphocytes Relative 1 %   Lymphs Abs 0.3 (L) 0.7 - 4.0 K/uL   Monocytes Relative 1 %   Monocytes  Absolute 0.3 0.1 - 1.0 K/uL   Eosinophils Relative 0 %   Eosinophils Absolute 0.0 0.0 - 0.5 K/uL   Basophils Relative 0 %   Basophils Absolute 0.0 0.0 - 0.1 K/uL   nRBC 1 (H) 0 /100 WBC   Abs Immature Granulocytes 0.00 0.00 - 0.07 K/uL    Comment: Performed at [facility] Lab, [address], [place], [place] [address]  Comprehensive metabolic panel     Status: Abnormal   Collection Time: 06/27/23  2:45 AM  Result Value Ref Range   Sodium 151 (H) 135 - 145 mmol/L   Potassium 3.3 (L) 3.5 - 5.1 mmol/L   Chloride 114 (H) 98 - 111 mmol/L   CO2 26 22 - 32 mmol/L   Glucose, Bld 138 (H) 70 - 99 mg/dL    Comment: Glucose reference range applies only to samples taken after fasting for at least 8 hours.   BUN 38 (H) 8 - 23 mg/dL   Creatinine, Ser 1.32 (H) 0.44 - 1.00 mg/dL   Calcium 9.0 8.9 - 44.0 mg/dL   Total Protein 5.9 (L) 6.5 - 8.1 g/dL   Albumin 2.1 (L) 3.5 - 5.0 g/dL   AST 102 (H) 15 - 41 U/L   ALT 109 (H) 0 - 44 U/L   Alkaline Phosphatase 199 (H) 38 - 126 U/L   Total Bilirubin 0.7 0.3 - 1.2 mg/dL   GFR, Estimated 50 (L) >60 mL/min    Comment: (NOTE) Calculated using the CKD-EPI  Creatinine Equation (2021)    Anion gap 11 5 - 15    Comment: Performed at [facility] Lab, [address] [place], [place] [address]  Magnesium     Status: Abnormal   Collection Time: 06/27/23  2:45 AM  Result Value Ref Range   Magnesium 3.1 (H) 1.7 - 2.4 mg/dL    Comment: Performed at [facility] Lab, [address] [place], [place] [address]  Procalcitonin     Status: None   Collection Time: 06/27/23  2:45 AM  Result Value Ref Range   Procalcitonin 1.53 ng/mL    Comment:        Interpretation: PCT > 0.5 ng/mL and <= 2 ng/mL: Systemic infection (sepsis) is possible, but other conditions are known to elevate PCT as well. (NOTE)       Sepsis PCT Algorithm           Lower Respiratory Tract                                      Infection PCT Algorithm    ----------------------------      ----------------------------         PCT < 0.25 ng/mL                PCT < 0.10 ng/mL          Strongly encourage             Strongly discourage   discontinuation of antibiotics    initiation of antibiotics    ----------------------------     -----------------------------       PCT 0.25 - 0.50 ng/mL            PCT 0.10 - 0.25 ng/mL               OR       >80% decrease in PCT            Discourage initiation of                                            antibiotics      Encourage discontinuation           of antibiotics    ----------------------------     -----------------------------         PCT >= 0.50 ng/mL              PCT 0.26 - 0.50 ng/mL                AND       <80% decrease in PCT             Encourage initiation of                                             antibiotics       Encourage continuation           of antibiotics    ----------------------------     -----------------------------        PCT >= 0.50 ng/mL                  PCT > 0.50  ng/mL               AND         increase in PCT                  Strongly encourage                                      initiation of antibiotics    Strongly encourage escalation           of antibiotics                                     -----------------------------                                           PCT <= 0.25 ng/mL                                                 OR                                        > 80% decrease in PCT                                      Discontinue / Do not initiate                                             antibiotics  Performed at [facility], [address], [place], [place] [address]   Gamma GT     Status: Abnormal   Collection Time: 06/27/23  2:45 AM  Result Value Ref Range   GGT 105 (H) 7 - 50 U/L    Comment: Performed at [facility], [address], [place], [place] [address]  Protime-INR     Status: Abnormal   Collection Time: 06/27/23  2:45 AM  Result Value Ref  Range   Prothrombin Time 16.6 (H) 11.4 - 15.2 seconds   INR 1.3 (H) 0.8 - 1.2    Comment: (NOTE) INR goal varies based on device and disease states. Performed at [facility], [address] [place], [place] [address]   Basic metabolic panel     Status: Abnormal   Collection Time: 06/27/23  8:47 AM  Result Value Ref Range   Sodium 149 (H) 135 - 145 mmol/L   Potassium 3.0 (L) 3.5 - 5.1 mmol/L   Chloride 113 (H) 98 - 111 mmol/L   CO2 26 22 - 32 mmol/L   Glucose, Bld 156 (H) 70 - 99 mg/dL    Comment: Glucose reference range applies only to samples taken after fasting for at least 8 hours.   BUN 37 (H) 8 - 23 mg/dL   Creatinine, Ser 6.01 0.44 -  1.00 mg/dL   Calcium 8.7 (L) 8.9 - 10.3 mg/dL   GFR, Estimated >84 >13 mL/min    Comment: (NOTE) Calculated using the CKD-EPI Creatinine Equation (2021)    Anion gap 10 5 - 15    Comment: Performed at [facility] Lab, [address] [place], [place] [address]   CT Angio Chest PE W and/or Wo Contrast  Result Date: 06/26/2023 CLINICAL DATA:  Abdominal pain, difficulty breathing EXAM: CT ANGIOGRAPHY CHEST CT ABDOMEN AND PELVIS WITH CONTRAST TECHNIQUE: Multidetector CT imaging of the chest was performed using the standard protocol during bolus administration of intravenous contrast. Multiplanar CT image reconstructions and MIPs were obtained to evaluate the vascular anatomy. Multidetector CT imaging of the abdomen and pelvis was performed using the standard protocol during bolus administration of intravenous contrast. RADIATION DOSE REDUCTION: This exam was performed according to the departmental dose-optimization program which includes automated exposure control, adjustment of the mA and/or kV according to patient size and/or use of iterative reconstruction technique. CONTRAST:  75mL OMNIPAQUE IOHEXOL 350 MG/ML SOLN COMPARISON:  06/26/2023 FINDINGS: CTA CHEST FINDINGS Cardiovascular: This is a technically adequate evaluation of the pulmonary  vasculature. No filling defects or pulmonary emboli. The heart is unremarkable without pericardial effusion. No evidence of thoracic aortic aneurysm or dissection. Atherosclerosis of the aorta and coronary vasculature. Mild stenosis at the origin of the left subclavian artery approaches 50%. Mediastinum/Nodes: Subcarinal adenopathy measuring up to 12 mm in short axis. Thyroid, trachea, and esophagus are unremarkable. Lungs/Pleura: There is a thick-walled cavitary mass within the right lower lobe measuring 5.5 x 4.1 by 4.7 cm. It is unclear whether this reflects a pulmonary abscess or cavitary neoplasm. There is complete occlusion of the right mainstem bronchus, proximal right upper lobe bronchus, bronchus intermedius, and right lower lobe bronchus. There is dense consolidation throughout the right lower lobe, greatest posteriorly. Central obstructing neoplasm cannot be excluded. No effusion or pneumothorax. Musculoskeletal: There are no acute displaced fractures. Nonspecific sclerotic focus within the right anterior fifth rib. No destructive bony lesions. Reconstructed images demonstrate no additional findings. Review of the MIP images confirms the above findings. CT ABDOMEN and PELVIS FINDINGS Hepatobiliary: No focal liver abnormality is seen. No gallstones, gallbladder wall thickening, or biliary dilatation. Pancreas: Unremarkable. No pancreatic ductal dilatation or surrounding inflammatory changes. Spleen: Normal in size without focal abnormality. Adrenals/Urinary Tract: Adrenal glands are unremarkable. Kidneys are normal, without renal calculi, focal lesion, or hydronephrosis. Bladder is unremarkable. Stomach/Bowel: No bowel obstruction or ileus. Large amount of stool within the rectal vault consistent with fecal impaction. No bowel wall thickening or inflammatory change. Vascular/Lymphatic: Aortic atherosclerosis. No enlarged abdominal or pelvic lymph nodes. Reproductive: Status post hysterectomy. No adnexal  masses. Other: No free fluid or free intraperitoneal gas. No abdominal wall hernia. Musculoskeletal: No acute or destructive bony abnormalities. Reconstructed images demonstrate no additional findings. Review of the MIP images confirms the above findings. IMPRESSION: Chest: 1. No evidence of pulmonary embolus. 2. Thick-walled cavitary masslike lesion within the right lower lobe, with associated dense right lower lobe consolidation. This could reflect neoplasm or cavitating pneumonia. Pulmonology consultation and bronchoscopy may be useful. 3. Opacification of the right mainstem bronchus, proximal right upper lobe bronchus, and bronchus intermedius and right lower lobe bronchial tree. Differential includes infection or obstructing neoplasm. Again, bronchoscopy may be useful. 4. Subcarinal adenopathy, which could be reactive or metastatic. 5.  Aortic Atherosclerosis (ICD10-I70.0). Abdomen/pelvis: 1. Fecal impaction.  No bowel obstruction or ileus. 2.  Aortic Atherosclerosis (ICD10-I70.0). Electronically Signed  By: Sharlet Salina M.D.   On: 06/26/2023 22:03   CT ABDOMEN PELVIS WO CONTRAST  Result Date: 06/26/2023 CLINICAL DATA:  Abdominal pain, difficulty breathing EXAM: CT ANGIOGRAPHY CHEST CT ABDOMEN AND PELVIS WITH CONTRAST TECHNIQUE: Multidetector CT imaging of the chest was performed using the standard protocol during bolus administration of intravenous contrast. Multiplanar CT image reconstructions and MIPs were obtained to evaluate the vascular anatomy. Multidetector CT imaging of the abdomen and pelvis was performed using the standard protocol during bolus administration of intravenous contrast. RADIATION DOSE REDUCTION: This exam was performed according to the departmental dose-optimization program which includes automated exposure control, adjustment of the mA and/or kV according to patient size and/or use of iterative reconstruction technique. CONTRAST:  75mL OMNIPAQUE IOHEXOL 350 MG/ML SOLN  COMPARISON:  06/26/2023 FINDINGS: CTA CHEST FINDINGS Cardiovascular: This is a technically adequate evaluation of the pulmonary vasculature. No filling defects or pulmonary emboli. The heart is unremarkable without pericardial effusion. No evidence of thoracic aortic aneurysm or dissection. Atherosclerosis of the aorta and coronary vasculature. Mild stenosis at the origin of the left subclavian artery approaches 50%. Mediastinum/Nodes: Subcarinal adenopathy measuring up to 12 mm in short axis. Thyroid, trachea, and esophagus are unremarkable. Lungs/Pleura: There is a thick-walled cavitary mass within the right lower lobe measuring 5.5 x 4.1 by 4.7 cm. It is unclear whether this reflects a pulmonary abscess or cavitary neoplasm. There is complete occlusion of the right mainstem bronchus, proximal right upper lobe bronchus, bronchus intermedius, and right lower lobe bronchus. There is dense consolidation throughout the right lower lobe, greatest posteriorly. Central obstructing neoplasm cannot be excluded. No effusion or pneumothorax. Musculoskeletal: There are no acute displaced fractures. Nonspecific sclerotic focus within the right anterior fifth rib. No destructive bony lesions. Reconstructed images demonstrate no additional findings. Review of the MIP images confirms the above findings. CT ABDOMEN and PELVIS FINDINGS Hepatobiliary: No focal liver abnormality is seen. No gallstones, gallbladder wall thickening, or biliary dilatation. Pancreas: Unremarkable. No pancreatic ductal dilatation or surrounding inflammatory changes. Spleen: Normal in size without focal abnormality. Adrenals/Urinary Tract: Adrenal glands are unremarkable. Kidneys are normal, without renal calculi, focal lesion, or hydronephrosis. Bladder is unremarkable. Stomach/Bowel: No bowel obstruction or ileus. Large amount of stool within the rectal vault consistent with fecal impaction. No bowel wall thickening or inflammatory change.  Vascular/Lymphatic: Aortic atherosclerosis. No enlarged abdominal or pelvic lymph nodes. Reproductive: Status post hysterectomy. No adnexal masses. Other: No free fluid or free intraperitoneal gas. No abdominal wall hernia. Musculoskeletal: No acute or destructive bony abnormalities. Reconstructed images demonstrate no additional findings. Review of the MIP images confirms the above findings. IMPRESSION: Chest: 1. No evidence of pulmonary embolus. 2. Thick-walled cavitary masslike lesion within the right lower lobe, with associated dense right lower lobe consolidation. This could reflect neoplasm or cavitating pneumonia. Pulmonology consultation and bronchoscopy may be useful. 3. Opacification of the right mainstem bronchus, proximal right upper lobe bronchus, and bronchus intermedius and right lower lobe bronchial tree. Differential includes infection or obstructing neoplasm. Again, bronchoscopy may be useful. 4. Subcarinal adenopathy, which could be reactive or metastatic. 5.  Aortic Atherosclerosis (ICD10-I70.0). Abdomen/pelvis: 1. Fecal impaction.  No bowel obstruction or ileus. 2.  Aortic Atherosclerosis (ICD10-I70.0). Electronically Signed   By: Sharlet Salina M.D.   On: 06/26/2023 22:03   DG Chest Port 1 View  Result Date: 06/26/2023 CLINICAL DATA:  Difficulty breathing EXAM: PORTABLE CHEST 1 VIEW COMPARISON:  None Available. FINDINGS: Left lung is clear. Asymmetric right infrahilar opacity. Normal cardiac size  with aortic atherosclerosis. No pneumothorax IMPRESSION: Asymmetric vague right infrahilar opacity, indeterminate for pneumonia, aspiration or mass. Suggest further assessment with chest CT. Electronically Signed   By: Jasmine Pang M.D.   On: 06/26/2023 20:16    Pending Labs Unresulted Labs (From admission, onward)    None       Vitals/Pain Today's Vitals   06/27/23 0528 06/27/23 0800 06/27/23 0830 06/27/23 0956  BP:  127/71 129/80   Pulse:  73 75   Resp:  16 15   Temp: 97.8 F  (36.6 C)   98.1 F (36.7 C)  TempSrc: Axillary   Axillary  SpO2:  97% 97%   Weight:      Height:        Isolation Precautions No active isolations  Medications Medications  ofloxacin (OCUFLOX) 0.3 % ophthalmic solution 1 drop (1 drop Left Eye Given 06/27/23 0957)  magic mouthwash (has no administration in time range)  acetaminophen (TYLENOL) tablet 650 mg (has no administration in time range)    Or  acetaminophen (TYLENOL) suppository 650 mg (has no administration in time range)  morphine CONCENTRATE 10 MG/0.5ML oral solution 5 mg (has no administration in time range)    Or  morphine CONCENTRATE 10 MG/0.5ML oral solution 5 mg (has no administration in time range)  HYDROmorphone (DILAUDID) injection 0.5 mg (has no administration in time range)  LORazepam (ATIVAN) tablet 1 mg (has no administration in time range)    Or  LORazepam (ATIVAN) 2 MG/ML concentrated solution 1 mg (has no administration in time range)    Or  LORazepam (ATIVAN) injection 1 mg (has no administration in time range)  haloperidol (HALDOL) tablet 0.5 mg (has no administration in time range)    Or  haloperidol (HALDOL) 2 MG/ML solution 0.5 mg (has no administration in time range)    Or  haloperidol lactate (HALDOL) injection 2 mg (has no administration in time range)  ondansetron (ZOFRAN-ODT) disintegrating tablet 4 mg (has no administration in time range)    Or  ondansetron (ZOFRAN) injection 4 mg (has no administration in time range)  glycopyrrolate (ROBINUL) tablet 1 mg (has no administration in time range)    Or  glycopyrrolate (ROBINUL) injection 0.2 mg (has no administration in time range)    Or  glycopyrrolate (ROBINUL) injection 0.2 mg (has no administration in time range)  LORazepam (ATIVAN) injection 1 mg (has no administration in time range)  food thickener (SIMPLYTHICK (NECTAR/LEVEL 2/MILDLY THICK)) 1 packet (has no administration in time range)  lactated ringers bolus 500 mL (0 mLs Intravenous  Stopped 06/26/23 2244)  potassium chloride 10 mEq in 100 mL IVPB (0 mEq Intravenous Stopped 06/27/23 0203)  iohexol (OMNIPAQUE) 350 MG/ML injection 75 mL (75 mLs Intravenous Contrast Given 06/26/23 2143)  magnesium sulfate IVPB 2 g 50 mL (0 g Intravenous Stopped 06/27/23 0044)  Ampicillin-Sulbactam (UNASYN) 3 g in sodium chloride 0.9 % 100 mL IVPB (0 g Intravenous Stopped 06/27/23 0123)  lactated ringers bolus 500 mL ( Intravenous Stopped 06/27/23 0344)    Mobility non-ambulatory     Focused Assessments    R Recommendations: See Admitting Provider Note  Report given to:   Additional Notes: pt recently made comfort care. Pt is at baseline neurologically at this time .

## 2023-06-27 NOTE — Progress Notes (Signed)
    Referral received for Mikayla Ross :goals of care discussion. Chart reviewed in detail. Patient assessed and is unable to engage appropriately in discussions. No family present during both of my attempts to see this morning.  Spoke with Dr. Allena Katz and confirmed he has just discussed with family. Plan is to transition to full comfort care with possible transfer to residential hospice tomorrow if eligible. No acute palliative needs at this time.  PMT will continue to follow peripherally. Please do not hesitate to reach out if PMT can be of assistance. Thank you for your referral and allowing PMT to assist in [name]'s care.    Richardson Dopp, [facility] Palliative Medicine Team  Team Phone # [phone]   NO CHARGE

## 2023-06-27 NOTE — Hospital Course (Signed)
Brief hospital course: PMH of advanced Alzheimer's dementia, carotid stenosis, HLD, OSA, presented to hospital with complaints of choking episode with change in appetite. Found to have aspiration pneumonia with lung abscess and evidence of dehydration with hypernatremia and AKI. Discussion with the family was done on 7/28 and family decided transition to complete comfort with eventual plan for residential hospice.  Assessment and Plan: Severe sepsis with aspiration pneumonia and lung abscess. Acute hypoxic respiratory failure. Met SIRS criteria with leukocytosis and tachypnea with evidence of infection. Presents with aspiration.  Also had some shortness of breath. Saturation dropped to 84% on room air.  Does not use oxygen at her baseline. Currently requiring 6 L of oxygen. Prior history of aspiration secondary to progressive dementia. CT chest shows evidence of pneumonia as well as lung abscess.  Malignancy cannot be ruled out. Pulmonary was consulted.  While a bronc can be offered would certainly be a high risk for the patient given her current presentation. Initial recommendation was to continue IV antibiotics. Based on the conversation with the family I would like to focus on comfort and would like to avoid anything that can cause pain and suffering. Based on this condition currently comfort care. Family was informed that we will be stopping antibiotics and fluids.  Acute kidney injury. Hypernatremia. Hypokalemia. And admission serum sodium 157.  Serum creatinine 1.2.  Baseline 0.7. Treated with D5.  With improvement in serum sodium of 149. Mentation not baseline with progressively worsening in last 1 week per family. Suspect this is in the setting of poor p.o. intake likely associated with advancement of dementia. Currently transition to comfort care. Stopping IV fluid.  Goals of care conversation. Extensive discussion with the family on the phone regarding goals of care  conversation. Patient is minimally responsive, not following commands for me. Presents with aspiration event. Appears to have large lung abscess/possible malignancy. Also severely dehydrated with hypernatremia, hypokalemia, hyperchloremia.  And evidence of AKI. Appears to be underweight and malnourished. Severe dementia which appears to be progressing even back in notes in April 2024. Family's goal is to focus on comfort and avoiding any suffering or pain. Based on this conversation the family decided to transition the patient to complete comfort with goal of care transition to DNR. Eventual goal is transition to residential hospice based on patient's progression. Discussed with palliative care.  Elevated LFT. Likely in the setting of severe sepsis. Now comfort care.  Elevated troponin. Likely demand ischemia in the setting of dehydration, AKI as well as severe sepsis associated. Now comfort care.  Fecal impaction. Seen on the CT scan. No bowel obstruction. Dulcolax suppository and bowel regimen.  Aspiration/dysphagia. In the setting of dementia. Will allow oral diet with assistance although concern is that the patient will continue to aspirate. Currently comfort care.

## 2023-06-27 NOTE — Consult Note (Signed)
NAME:  Mikayla Ross, MRN:  [id number], DOB:  05-Apr-1951, LOS: 1 ADMISSION DATE:  06/26/2023, CONSULTATION DATE:  06/27/23 REFERRING MD:  EDP, CHIEF COMPLAINT:  sob   History of Present Illness:  This is a 72 year old with advancing Alzheimer's dementia who is presenting with acute on chronic shortness of breath, which began 2 days ago and gradually worse today.  Associated with altered mental status.  No gross aspiration symptoms but does have difficulty clearing secretions on a good day.  History as per family at bedside.  Patient has been having progressive worsening dementia over the past several months.  Frequent issues with expectoration.  They understand that she is nearing end-stage dementia.  Workup in the emergency room reveals acute hypoxemic respiratory failure, cavitary lung lesion, abnormal chest CT for which pulmonary is consulted.  Pertinent  Medical History  Alzheimer's dementia  Significant Hospital Events: Including procedures, antibiotic start and stop dates in addition to other pertinent events   06/19/2023 admission  Interim History / Subjective:  Consult  Objective   Blood pressure 125/60, pulse 90, temperature 98.4 F (36.9 C), resp. rate (!) 22, height 5\' 3"  (1.6 m), weight 56.7 kg, SpO2 94%.        Intake/Output Summary (Last 24 hours) at 06/27/2023 0011 Last data filed at 06/26/2023 2331 Gross per 24 hour  Intake 957.68 ml  Output --  Net 957.68 ml   Filed Weights   06/26/23 2010  Weight: 56.7 kg    Examination: General: Chronically ill-appearing elderly laying in bed HENT: Mucous membranes dry, poor dentition, left eyes with exudate consistent with conjunctivitis Lungs: Harsh rhonchi bilaterally, reduced breath sounds in the right base, weak cough Cardiovascular: Heart sounds regular, extremities warm Abdomen: Abdomen is soft, positive bowel sounds Extremities: No edema, positive muscle wasting Neuro: Deferred, vascular Skin no rashes  Labs notable  for leukocytosis. Chest CT reveals right mainstem bronchial cutoff, distal to this is a cavitary lung lesion.  Resolved Hospital Problem list   N/A  Assessment & Plan:  Acute hypoxemic respiratory failure with abnormal imaging most consistent with recurrent aspiration and lung abscess formation.  Discussed with family that while bronc could be offered, with does not change the underlying advancing dementia.  And strengthen her pulmonary toileting but would recommend consideration for hospice.  Family is agreeable to speak with palliative care.  They are also agreeable to hold off on bronchoscopy at this time. - Unasyn or similar, should a full course be desired with treat for 4 weeks and repeat a CT scan at that time to determine total duration - Follow-up culture data - CPT and hypertonic saline nebs - Palliative care consult - Ophthalmic drops for left conjunctivitis - Please reach out after palliative consultation if family decides against SNF/hospice and we can arrange OP f/u; otherwise would just treat pneumonia for a few weeks then stop  Best Practice (right click and "Reselect all SmartList Selections" daily)  Per primary  Labs   CBC: Recent Labs  Lab 06/26/23 2010 06/26/23 2017  WBC 20.9*  --   NEUTROABS 19.2*  --   HGB 11.7* 11.9*  HCT 39.2 35.0*  MCV 99.7  --   PLT 380  --     Basic Metabolic Panel: Recent Labs  Lab 06/26/23 2010 06/26/23 2017 06/26/23 2208  NA 156* 157*  --   K 2.7* 2.7*  --   CL 112*  --   --   CO2 25  --   --  GLUCOSE 151*  --   --   BUN 40*  --   --   CREATININE 1.25*  --   --   CALCIUM 9.5  --   --   MG  --   --  2.4   GFR: Estimated Creatinine Clearance: 34.6 mL/min (A) (by C-G formula based on SCr of 1.25 mg/dL (H)). Recent Labs  Lab 06/26/23 2010 06/26/23 2207 06/26/23 2217  WBC 20.9*  --   --   LATICACIDVEN  --  3.1* 1.6    Liver Function Tests: Recent Labs  Lab 06/26/23 2010  AST 67*  ALT 106*  ALKPHOS 232*   BILITOT 0.9  PROT 6.5  ALBUMIN 2.3*   No results for input(s): "LIPASE", "AMYLASE" in the last 168 hours. No results for input(s): "AMMONIA" in the last 168 hours.  ABG    Component Value Date/Time   HCO3 29.1 (H) 06/26/2023 2017   TCO2 30 06/26/2023 2017   O2SAT 92 06/26/2023 2017     Coagulation Profile: Recent Labs  Lab 06/26/23 2047  INR 1.3*    Cardiac Enzymes: No results for input(s): "CKTOTAL", "CKMB", "CKMBINDEX", "TROPONINI" in the last 168 hours.  HbA1C: No results found for: "HGBA1C"  CBG: No results for input(s): "GLUCAP" in the last 168 hours.  Review of Systems:   Nonverbal  Past Medical History:  She,  has a past medical history of Bilateral carotid artery stenosis (01/05/2018), Estrogen deficiency (02/14/2018), High cholesterol, Hyperlipidemia (10/06/2019), Major neurocognitive disorder, unclear etiology (07/31/2020), Obstructive sleep apnea, Right-sided Bell's palsy (12/27/2017), and Stroke.   Surgical History:   Past Surgical History:  Procedure Laterality Date   ABDOMINAL HYSTERECTOMY     APPENDECTOMY     TONSILLECTOMY       Social History:   reports that she has quit smoking. Her smoking use included cigarettes. She has a 25 pack-year smoking history. She has never used smokeless tobacco. She reports that she does not drink alcohol and does not use drugs.   Family History:  Her family history includes Renal Disease in her father and mother; Seizures in her father. There is no history of Cancer.   Allergies No Known Allergies   Home Medications  Prior to Admission medications   Medication Sig Start Date End Date Taking? Authorizing Provider  atorvastatin (LIPITOR) 40 MG tablet Take 1 tablet (40 mg total) by mouth daily. 10/26/22  Yes Sharlene Dory, DO  buPROPion (WELLBUTRIN XL) 150 MG 24 hr tablet Take 1 tablet by mouth once daily 06/21/23  Yes Wendling, Jilda Roche, DO  memantine (NAMENDA) 10 MG tablet Take 1 tablet (10 mg total)  by mouth 2 (two) times daily. 11/04/22  Yes Sharlene Dory, DO  QUEtiapine (SEROQUEL) 25 MG tablet TAKE 1 TABLET BY MOUTH AT BEDTIME 06/21/23  Yes Wendling, Jilda Roche, DO  sertraline (ZOLOFT) 50 MG tablet Take 1 tablet (50 mg total) by mouth daily. 03/12/22  Yes Sharlene Dory, DO     Critical care time: N/A

## 2023-06-27 NOTE — ED Notes (Signed)
Please update daughter

## 2023-06-27 NOTE — H&P (Signed)
History and Physical      Mikayla Ross:[id number] DOB: 1951-05-19 DOA: 06/26/2023; DOS: 06/27/2023  PCP: Sharlene Dory, DO  Patient coming from: home   I have personally briefly reviewed patient's old medical records in [facility]  Chief Complaint: Shortness of breath  HPI: Mikayla Ross is a 72 y.o. female with medical history significant for advanced dementia, depression, hyperlipidemia, who is admitted to [facility] on 06/26/2023 with aspiration pneumonia after presenting from home to [facility] ED complaining of shortness of breath.   In the setting of the patient's advanced dementia, the following history is provided by the patient's family in addition to my discussions with the EDP and via chart review.  The patient was eating dinner, when family noted the patient to start checking, with development of acute onset shortness of breath, prompting EMS to be contacted, who noted patient's initial oxygen saturations to be in the 70s on RA, relative to no known baseline supplemental oxygen requirements, before transporting the patient to [facility] emergency department for further evaluation management thereof.  Family conveys that the patient has advanced dementia, and has had progressive decline in oral intake over the last several weeks, with very limited consumption of food or water over that timeframe.   She is a former smoker, but has no known chronic underlying pulmonary pathology.  Per chart review, most recent prior liver enzymes were checked in February 2023 and were notable at that time for the following: Alkaline phosphatase 53, AST 13, ALT 12, total bilirubin 0.4.      ED Course:  Vital signs in the ED were notable for the following: Afebrile; initial heart rates in the low 100s, socially decreasing into the 90s following initiation of IV fluids, as further detailed below; systolic blood pressures in the 110s to 130s; respiratory rate 21-22, oxygen  saturation 82% on room air, subsequently increasing to 97% on 6 L nasal cannula, now down to 4 L nasal cannula upon which oxygen saturations have been 94 to 95%.  Labs were notable for the following: CMP notable for the following: Sodium 156 compared to most recent prior data point of 142 in February 2023, potassium 2.7, chloride 112, bicarbonate 25, creatinine 1.25 compared to most recent prior serum creatinine data point of 0.88 in February 2023, BUN to creatinine ratio greater than 20, glucose 151, calcium, adjusted for hypoalbuminemia noted to be 10.8, evident 2 x 3, alkaline phosphatase 232, AST 67, ALT 106, total bilirubin 0.9.  BNP 151, without any prior BMP data points available for comparison.  High-sensitivity troponin I initially 34, 35 2030.  Initial lactate 3.1, with repeat value trending down to 1.6.  CBC notable for white blood cell count 20,900 with 90% neutrophils.  INR 1.3.  Urinalysis ordered, with result currently pending.  Blood cultures x 2 collected prior to initiation of IV antibiotics.  COVID, influenza, RSV PCR all negative.  Per my interpretation, EKG in ED demonstrated the following: Sinus tachycardia with heart rate 111, normal intervals, no evidence of T wave or ST changes, including no evidence of ST elevation.  Imaging in the ED, per corresponding formal radiology read, was notable for the following: Chest x-ray showed right infrahilar opacity concerning for pneumonia, including aspiration pneumonia, without evidence of edema, effusion, or pneumothorax.  CTA chest showed no evidence of acute pulmonary embolism, but showed evidence of a thick-walled cavitary masslike lesion within the right lower lobe, with differential including neoplasm versus cavitary pneumonia.  CT  abdomen/pelvis without contrast showed no evidence of focal liver abnormality, nor any evidence of gallstones or any evidence of gallbladder wall thickening, pericholecystic fluid, dilation of the common bile duct  and no evidence of choledocholithiasis while showing some fecal impaction without evidence of obstruction or ileus.  EDP discussed patient's case with on-call PCCM, Dr. Levon Hedger, who has consulted, and recommends palliative care consult for consideration of initiation of hospice, particular in the context of the patient's advanced dementia. Family amenable to meeting with palliative care.  While in the ED, the following were administered: Initially, the patient received doses of azithromycin and Rocephin, before being transitioned to Unasyn; lactated Ringer's x 500 cc bolus, magnesium sulfate 2 g IV over 2 hours, potassium chloride 30 mill equivalents IV over 3 hours.  Subsequently, the patient was admitted for further evaluation management of severe sepsis due to suspected aspiration pneumonia complicated by acute hypoxic respiratory failure with new finding of cavitary masslike lung lesion in the right lower lobe, and with presenting labs notable for acute transaminitis, hyponatremia, hypercalcemia, hypokalemia, and acute kidney injury.     Review of Systems: As per HPI otherwise 10 point review of systems negative.   Past Medical History:  Diagnosis Date   Bilateral carotid artery stenosis 01/05/2018   Estrogen deficiency 02/14/2018   High cholesterol    Hyperlipidemia 10/06/2019   Major neurocognitive disorder, unclear etiology 07/31/2020   possible Alzheimer's disease   Obstructive sleep apnea    Right-sided Bell's palsy 12/27/2017   Stroke    Asymptomatic ischemic left cerebellar stroke    Past Surgical History:  Procedure Laterality Date   ABDOMINAL HYSTERECTOMY     APPENDECTOMY     TONSILLECTOMY      Social History:  reports that she has quit smoking. Her smoking use included cigarettes. She has a 25 pack-year smoking history. She has never used smokeless tobacco. She reports that she does not drink alcohol and does not use drugs.   No Known Allergies  Family History   Problem Relation Age of Onset   Renal Disease Mother    Seizures Father    Renal Disease Father    Cancer Neg Hx     Family history reviewed and not pertinent    Prior to Admission medications   Medication Sig Start Date End Date Taking? Authorizing Provider  atorvastatin (LIPITOR) 40 MG tablet Take 1 tablet (40 mg total) by mouth daily. 10/26/22  Yes Sharlene Dory, DO  buPROPion (WELLBUTRIN XL) 150 MG 24 hr tablet Take 1 tablet by mouth once daily 06/21/23  Yes Wendling, Jilda Roche, DO  memantine (NAMENDA) 10 MG tablet Take 1 tablet (10 mg total) by mouth 2 (two) times daily. 11/04/22  Yes Sharlene Dory, DO  QUEtiapine (SEROQUEL) 25 MG tablet TAKE 1 TABLET BY MOUTH AT BEDTIME 06/21/23  Yes Wendling, Jilda Roche, DO  sertraline (ZOLOFT) 50 MG tablet Take 1 tablet (50 mg total) by mouth daily. 03/12/22  Yes Sharlene Dory, DO     Objective    Physical Exam: Vitals:   06/26/23 2115 06/26/23 2215 06/26/23 2230 06/26/23 2345  BP: 95/70 117/68 111/69 125/60  Pulse: 85 92 91 90  Resp: (!) 22 (!) 21 (!) 22 (!) 22  Temp:      SpO2: 97% 94% 94% 94%  Weight:      Height:        General: appears to be stated age; confused Skin: warm, dry, no rash Head:  AT/[place] Mouth:  Oral mucosa membranes appear dry, normal dentition Neck: supple; trachea midline Heart: Tachycardic, but regular; did not appreciate any M/R/G Lungs: CTAB, did not appreciate any wheezes, rales, or rhonchi Abdomen: + BS; soft, ND, NT Vascular: 2+ pedal pulses b/l; 2+ radial pulses b/l Extremities: no peripheral edema, no muscle wasting Neuro: strength and sensation intact in upper and lower extremities b/l    Labs on Admission: I have personally reviewed following labs and imaging studies  CBC: Recent Labs  Lab 06/26/23 2010 06/26/23 2017  WBC 20.9*  --   NEUTROABS 19.2*  --   HGB 11.7* 11.9*  HCT 39.2 35.0*  MCV 99.7  --   PLT 380  --    Basic Metabolic Panel: Recent  Labs  Lab 06/26/23 2010 06/26/23 2017 06/26/23 2208  NA 156* 157*  --   K 2.7* 2.7*  --   CL 112*  --   --   CO2 25  --   --   GLUCOSE 151*  --   --   BUN 40*  --   --   CREATININE 1.25*  --   --   CALCIUM 9.5  --   --   MG  --   --  2.4   GFR: Estimated Creatinine Clearance: 34.6 mL/min (A) (by C-G formula based on SCr of 1.25 mg/dL (H)). Liver Function Tests: Recent Labs  Lab 06/26/23 2010  AST 67*  ALT 106*  ALKPHOS 232*  BILITOT 0.9  PROT 6.5  ALBUMIN 2.3*   No results for input(s): "LIPASE", "AMYLASE" in the last 168 hours. No results for input(s): "AMMONIA" in the last 168 hours. Coagulation Profile: Recent Labs  Lab 06/26/23 2047  INR 1.3*   Cardiac Enzymes: No results for input(s): "CKTOTAL", "CKMB", "CKMBINDEX", "TROPONINI" in the last 168 hours. BNP (last 3 results) No results for input(s): "PROBNP" in the last 8760 hours. HbA1C: No results for input(s): "HGBA1C" in the last 72 hours. CBG: No results for input(s): "GLUCAP" in the last 168 hours. Lipid Profile: No results for input(s): "CHOL", "HDL", "LDLCALC", "TRIG", "CHOLHDL", "LDLDIRECT" in the last 72 hours. Thyroid Function Tests: No results for input(s): "TSH", "T4TOTAL", "FREET4", "T3FREE", "THYROIDAB" in the last 72 hours. Anemia Panel: No results for input(s): "VITAMINB12", "FOLATE", "FERRITIN", "TIBC", "IRON", "RETICCTPCT" in the last 72 hours. Urine analysis: No results found for: "COLORURINE", "APPEARANCEUR", "LABSPEC", "PHURINE", "GLUCOSEU", "HGBUR", "BILIRUBINUR", "KETONESUR", "PROTEINUR", "UROBILINOGEN", "NITRITE", "LEUKOCYTESUR"  Radiological Exams on Admission: CT Angio Chest PE W and/or Wo Contrast  Result Date: 06/26/2023 CLINICAL DATA:  Abdominal pain, difficulty breathing EXAM: CT ANGIOGRAPHY CHEST CT ABDOMEN AND PELVIS WITH CONTRAST TECHNIQUE: Multidetector CT imaging of the chest was performed using the standard protocol during bolus administration of intravenous contrast.  Multiplanar CT image reconstructions and MIPs were obtained to evaluate the vascular anatomy. Multidetector CT imaging of the abdomen and pelvis was performed using the standard protocol during bolus administration of intravenous contrast. RADIATION DOSE REDUCTION: This exam was performed according to the departmental dose-optimization program which includes automated exposure control, adjustment of the mA and/or kV according to patient size and/or use of iterative reconstruction technique. CONTRAST:  75mL OMNIPAQUE IOHEXOL 350 MG/ML SOLN COMPARISON:  06/26/2023 FINDINGS: CTA CHEST FINDINGS Cardiovascular: This is a technically adequate evaluation of the pulmonary vasculature. No filling defects or pulmonary emboli. The heart is unremarkable without pericardial effusion. No evidence of thoracic aortic aneurysm or dissection. Atherosclerosis of the aorta and coronary vasculature. Mild stenosis at the origin of the left subclavian artery  approaches 50%. Mediastinum/Nodes: Subcarinal adenopathy measuring up to 12 mm in short axis. Thyroid, trachea, and esophagus are unremarkable. Lungs/Pleura: There is a thick-walled cavitary mass within the right lower lobe measuring 5.5 x 4.1 by 4.7 cm. It is unclear whether this reflects a pulmonary abscess or cavitary neoplasm. There is complete occlusion of the right mainstem bronchus, proximal right upper lobe bronchus, bronchus intermedius, and right lower lobe bronchus. There is dense consolidation throughout the right lower lobe, greatest posteriorly. Central obstructing neoplasm cannot be excluded. No effusion or pneumothorax. Musculoskeletal: There are no acute displaced fractures. Nonspecific sclerotic focus within the right anterior fifth rib. No destructive bony lesions. Reconstructed images demonstrate no additional findings. Review of the MIP images confirms the above findings. CT ABDOMEN and PELVIS FINDINGS Hepatobiliary: No focal liver abnormality is seen. No  gallstones, gallbladder wall thickening, or biliary dilatation. Pancreas: Unremarkable. No pancreatic ductal dilatation or surrounding inflammatory changes. Spleen: Normal in size without focal abnormality. Adrenals/Urinary Tract: Adrenal glands are unremarkable. Kidneys are normal, without renal calculi, focal lesion, or hydronephrosis. Bladder is unremarkable. Stomach/Bowel: No bowel obstruction or ileus. Large amount of stool within the rectal vault consistent with fecal impaction. No bowel wall thickening or inflammatory change. Vascular/Lymphatic: Aortic atherosclerosis. No enlarged abdominal or pelvic lymph nodes. Reproductive: Status post hysterectomy. No adnexal masses. Other: No free fluid or free intraperitoneal gas. No abdominal wall hernia. Musculoskeletal: No acute or destructive bony abnormalities. Reconstructed images demonstrate no additional findings. Review of the MIP images confirms the above findings. IMPRESSION: Chest: 1. No evidence of pulmonary embolus. 2. Thick-walled cavitary masslike lesion within the right lower lobe, with associated dense right lower lobe consolidation. This could reflect neoplasm or cavitating pneumonia. Pulmonology consultation and bronchoscopy may be useful. 3. Opacification of the right mainstem bronchus, proximal right upper lobe bronchus, and bronchus intermedius and right lower lobe bronchial tree. Differential includes infection or obstructing neoplasm. Again, bronchoscopy may be useful. 4. Subcarinal adenopathy, which could be reactive or metastatic. 5.  Aortic Atherosclerosis (ICD10-I70.0). Abdomen/pelvis: 1. Fecal impaction.  No bowel obstruction or ileus. 2.  Aortic Atherosclerosis (ICD10-I70.0). Electronically Signed   By: Sharlet Salina M.D.   On: 06/26/2023 22:03   CT ABDOMEN PELVIS WO CONTRAST  Result Date: 06/26/2023 CLINICAL DATA:  Abdominal pain, difficulty breathing EXAM: CT ANGIOGRAPHY CHEST CT ABDOMEN AND PELVIS WITH CONTRAST TECHNIQUE:  Multidetector CT imaging of the chest was performed using the standard protocol during bolus administration of intravenous contrast. Multiplanar CT image reconstructions and MIPs were obtained to evaluate the vascular anatomy. Multidetector CT imaging of the abdomen and pelvis was performed using the standard protocol during bolus administration of intravenous contrast. RADIATION DOSE REDUCTION: This exam was performed according to the departmental dose-optimization program which includes automated exposure control, adjustment of the mA and/or kV according to patient size and/or use of iterative reconstruction technique. CONTRAST:  75mL OMNIPAQUE IOHEXOL 350 MG/ML SOLN COMPARISON:  06/26/2023 FINDINGS: CTA CHEST FINDINGS Cardiovascular: This is a technically adequate evaluation of the pulmonary vasculature. No filling defects or pulmonary emboli. The heart is unremarkable without pericardial effusion. No evidence of thoracic aortic aneurysm or dissection. Atherosclerosis of the aorta and coronary vasculature. Mild stenosis at the origin of the left subclavian artery approaches 50%. Mediastinum/Nodes: Subcarinal adenopathy measuring up to 12 mm in short axis. Thyroid, trachea, and esophagus are unremarkable. Lungs/Pleura: There is a thick-walled cavitary mass within the right lower lobe measuring 5.5 x 4.1 by 4.7 cm. It is unclear whether this reflects a pulmonary abscess  or cavitary neoplasm. There is complete occlusion of the right mainstem bronchus, proximal right upper lobe bronchus, bronchus intermedius, and right lower lobe bronchus. There is dense consolidation throughout the right lower lobe, greatest posteriorly. Central obstructing neoplasm cannot be excluded. No effusion or pneumothorax. Musculoskeletal: There are no acute displaced fractures. Nonspecific sclerotic focus within the right anterior fifth rib. No destructive bony lesions. Reconstructed images demonstrate no additional findings. Review of the  MIP images confirms the above findings. CT ABDOMEN and PELVIS FINDINGS Hepatobiliary: No focal liver abnormality is seen. No gallstones, gallbladder wall thickening, or biliary dilatation. Pancreas: Unremarkable. No pancreatic ductal dilatation or surrounding inflammatory changes. Spleen: Normal in size without focal abnormality. Adrenals/Urinary Tract: Adrenal glands are unremarkable. Kidneys are normal, without renal calculi, focal lesion, or hydronephrosis. Bladder is unremarkable. Stomach/Bowel: No bowel obstruction or ileus. Large amount of stool within the rectal vault consistent with fecal impaction. No bowel wall thickening or inflammatory change. Vascular/Lymphatic: Aortic atherosclerosis. No enlarged abdominal or pelvic lymph nodes. Reproductive: Status post hysterectomy. No adnexal masses. Other: No free fluid or free intraperitoneal gas. No abdominal wall hernia. Musculoskeletal: No acute or destructive bony abnormalities. Reconstructed images demonstrate no additional findings. Review of the MIP images confirms the above findings. IMPRESSION: Chest: 1. No evidence of pulmonary embolus. 2. Thick-walled cavitary masslike lesion within the right lower lobe, with associated dense right lower lobe consolidation. This could reflect neoplasm or cavitating pneumonia. Pulmonology consultation and bronchoscopy may be useful. 3. Opacification of the right mainstem bronchus, proximal right upper lobe bronchus, and bronchus intermedius and right lower lobe bronchial tree. Differential includes infection or obstructing neoplasm. Again, bronchoscopy may be useful. 4. Subcarinal adenopathy, which could be reactive or metastatic. 5.  Aortic Atherosclerosis (ICD10-I70.0). Abdomen/pelvis: 1. Fecal impaction.  No bowel obstruction or ileus. 2.  Aortic Atherosclerosis (ICD10-I70.0). Electronically Signed   By: Sharlet Salina M.D.   On: 06/26/2023 22:03   DG Chest Port 1 View  Result Date: 06/26/2023 CLINICAL DATA:   Difficulty breathing EXAM: PORTABLE CHEST 1 VIEW COMPARISON:  None Available. FINDINGS: Left lung is clear. Asymmetric right infrahilar opacity. Normal cardiac size with aortic atherosclerosis. No pneumothorax IMPRESSION: Asymmetric vague right infrahilar opacity, indeterminate for pneumonia, aspiration or mass. Suggest further assessment with chest CT. Electronically Signed   By: Jasmine Pang M.D.   On: 06/26/2023 20:16      Assessment/Plan   Principal Problem:   Aspiration pneumonia (HCC) Active Problems:   Hyperlipidemia   Severe sepsis (HCC)   Acute hypoxic respiratory failure (HCC)   Transaminitis   Acute hypernatremia   Dehydration   Cavitary lesion of lung   Hypercalcemia   Hypokalemia   AKI (acute kidney injury) (HCC)     #) Severe sepsis due to  Aspiration pneumonia: Witnessed aspiration event by family at dinnertime, with the patient then becoming acutely short of breath and hypoxic, with chest x-ray showing evidence of a right infrahilar opacity concerning for aspiration pneumonia.   SIRS criteria met via leukocytosis with neutrophilic predominance, tachycardia, tachypnea. Lactic acid level: Initially 3.1, with repeat trending down to 1.6. Of note, given the associated presence of suspected end organ damage in the form of concominant presenting elevated lactate, acute hypoxic respiratory failure, acute kidney injury, criteria are met for pt's sepsis to be considered severe in nature. However, in the absence of lactic acid level that is greater than or equal to 4.0, and in the absence of any associated hypotension refractory to IVF's, there are no indications for  administration of a 30 mL/kg IVF bolus at this time.   Additional ED work-up/management notable for: Blood cultures x 2 were collected.  Initially patient received azithromycin and Rocephin, before being transitioned to Unasyn.  Will continue Unasyn given suspected aspiration component to the patient's pneumonia.  May  also consider initiation of IV vancomycin given the appearance of cavitary lung lesion.  No e/o additional infectious process at this time, including COVID, influenza, RSV PCR were all negative.  Urinalysis is currently pending.   Plan: CBC w/ diff and CMP in AM.  Follow for results of blood cx's x 2. Abx: Continue Unasyn, as above.  Add on procalcitonin level.  Follow for result of urinalysis.  IV fluids, as further detailed below.  In the setting of the above aspiration event, will keep the patient n.p.o. for now.  Per critical care consultation, chest physiotherapy as well as scheduled hypertonic nebulizers also been ordered.               #) Acute hypoxic respiratory failure: in the context of acute respiratory symptoms and no known baseline supplemental O2 requirements, presenting O2 sat in the ED noted to be in the low 80s on room air, subsequently improving into the mid 90s on 4 L nasal cannula. Appears to be on basis of aspiration pneumonia, as further outlined above, potentially superimposed on right-sided cavitary lung lesion. No known chronic underlying pulmonary conditions .  Of note, CTA chest shows no evidence of acute pulmonary embolism.  Evidence of edema, pleural effusion, or pneumothorax.  Suspect that mildly elevated troponin is on the basis of supply demand mismatch resulting from the decrease in oxygen delivery capacity that is a/w presenting acute hypoxic respiratory distress as opposed to representing a type I process due to acute plaque rupture, particularly given presenting ekg that shows no e/o acute ischemic changes, including no evidence of STEMI, while noting that repeat troponin is now trending down.  Overall, ACS is felt to be less likely relative to type 2 supply demand mismatch, as above, but will closely monitor on telemetry overnight.   COVID-19/Influenza/RSV PCR are negative.    Plan: further evaluation/management of presenting aspiration pneumonia, as  above. Monitor continuous pulse ox with prn supplemental O2 to maintain O2 sats greater than or equal to 92%. monitor on telemetry. CMP/CBC in the AM. Check serum Mg and Phos levels.  Chest PT, scheduled hypertonic nebulizers.  Add on procalcitonin level.  PCCM consulted, as above.                 #) Cavitary masslike lung lesion: Today CTA chest shows evidence of thick-walled cavitary masslike lesion in the right lower lobe, with radiology conveying differential includes neoplasm versus acute cavitary pneumonia.  PCCM has been consulted, as above, with associated recommendation for palliative care consult for consideration of initiation of hospice, particular given the patient's advanced dementia.  Pending formal palliative care consult, will refrain from additional diagnostic evaluation thereof for now.   Plan: Critical care consulted, as above.  Palliative care consult placed, as above.               #) Acute transaminitis: As quantified above, with very mild elevation in alkaline phosphatase, but no elevation in total bilirubin to suggest a cholestatic pattern.  Of note, no evidence of acute focal liver pathology identified on today CT abdomen/pelvis, although this was a noncontrast study, which also showed no evidence of acute cholecystitis nor any evidence of biliary obstruction.  Differential includes potential evolving shock liver in the context of presenting significant initial hypoxia, as above.  Outpatient use of high intensity atorvastatin is also noted.  Plan: Hold him atorvastatin for now.  Repeat CMP in the morning.  Further evaluation management acute hypoxic respiratory failure, as above.  Check urinary drug screen.  Add on GGT.  Repeat INR in the morning.                 #) Hypovolemic hypernatremia: Presenting serum sodium of 156 compared to most recent prior value of 142 in February 2023.  Appears to be as a consequence of free water deficiency  as consequence of diminished free water intake, as above.  No report of any recent trauma.  No reported use of lithium as an outpatient.  Of note, per most recent documented body weight of 56 kg, total body water deficit calculated to be 2.9 L.  patient has now received 500 cc LR bolus in the emergency department this evening. Will provide an additional 500 cc LR bolus now and then plan to correct, with D5W, half of the patient's total body water deficit, which equates to 1450 cc, over the ensuing 24 hours, which would correlate with with the rate of D5W at 60 cc/h, and closely monitor ensuing serum sodium trend, monitoring for evidence of rapid correction, as further detailed below.    Plan: Check urinalysis, random urine sodium, random urine creatinine, urine osmolality, serum osmolality.  Monitor strict I's and O's and daily weights.  CMP in the morning, followed by BMP at 9 AM.  Lactated Ringer's x 100 cc bolus followed by D5W running at 60 cc/h x 24 hours, as further detailed above.                   #) Dehydration: Clinical suspicion for such, including the appearance of dry oral mucous membranes as well as laboratory findings notable for acute prerenal azotemia, improvement in mild tachycardia with IV fluids in the ED. this in the setting of significant decline in oral intake, as further outlined above. No e/o associated hypotension.   Plan: Monitor strict I's and O's.  Daily weights.  CMP in the morning.  500 cc LR bolus followed by transition to D5W over the next 24 hours, as further detailed above.                 #) Goals of care: Patient evaluated by critical care this evening, with associated recommendation for consultation of palliative care service, to which the patient's family is amenable, noting the patient's advanced dementia.   Plan: Palliative care consult placed.                #) Hypercalcemia: Presenting labs reflect corrected serum  calcium of 10.8.  Suspect element of dehydration, without overt pharmacologic contribution. Will pursue additional IV fluids, as above, with repeat calcium level in the morning, with consideration for further expansion of workup if not seeing improvement in serum calcium level with interval IV fluid administration.   Plan: IV fluids, as above.  Monitor strict I's&O's, daily weights.  CMP in the morning.  Check serum Mg and Phos levels.                    #) Hypokalemia: presenting potassium level noted to be 2.7.  She has received 30 mill equivalents of IV potassium chloride in the ED. will provide additional lactated Ringer's, as detailed below, but refrain from additional  aggressive dedicated potassium chloride supplementation given concomitant presenting acute kidney injury, with repeat serum potassium level pending in the morning.  Plan: monitor on tele.  Lactated Ringer's x 500 cc bolus , as above.  Add-on serum mag level. CMP, mag level in the AM.                  #) Acute Kidney Injury:  as quantified above.  Appears prerenal in the setting of intravascular depletion stemming from dehydration.  Urinalysis with microscopy has been ordered, Therazole currently pending.  No evidence of postrenal obstructive process on today CT abdomen/pelvis.  Plan: monitor strict I's & O's and daily weights. Attempt to avoid nephrotoxic agents. Refrain from NSAIDs. Repeat CMP in the morning. Check serum magnesium level.  Follow for result urinalysis with microscopy.  Add-on random urine sodium and random urine creatinine.  IV fluids, as above.                 #) Hyperlipidemia: documented h/o such. On high intensity atorvastatin as outpatient.  Notable in the context of presenting acute transaminitis.  Plan: Hold home statin for now.        DVT prophylaxis: SCD's   Code Status: Full code Family Communication: family at bedside Disposition Plan: Per Rounding  Team Consults called: EDP d/w on-call PCCM, Dr. Levon Hedger, as further detailed above;  Admission status: Inpatient     I SPENT GREATER THAN 75  MINUTES IN CLINICAL CARE TIME/MEDICAL DECISION-MAKING IN COMPLETING THIS ADMISSION.      Chaney Born Lawrencia Mauney DO [facility]  From 7PM - 7AM   06/27/2023, 12:48 AM

## 2023-06-27 NOTE — Progress Notes (Signed)
[facility] Progress Note Patient: Mikayla Ross XBJ:[id number] DOB: 10-Nov-1951 DOA: 06/26/2023  DOS: the patient was seen and examined on 06/27/2023  Brief hospital course: PMH of advanced Alzheimer's dementia, carotid stenosis, HLD, OSA, presented to hospital with complaints of choking episode with change in appetite. Found to have aspiration pneumonia with lung abscess and evidence of dehydration with hypernatremia and AKI. Discussion with the family was done on 7/28 and family decided transition to complete comfort with eventual plan for residential hospice.  Assessment and Plan: Severe sepsis with aspiration pneumonia and lung abscess. Acute hypoxic respiratory failure. Met SIRS criteria with leukocytosis and tachypnea with evidence of infection. Presents with aspiration.  Also had some shortness of breath. Saturation dropped to 84% on room air.  Does not use oxygen at her baseline. Currently requiring 6 L of oxygen. Prior history of aspiration secondary to progressive dementia. CT chest shows evidence of pneumonia as well as lung abscess.  Malignancy cannot be ruled out. Pulmonary was consulted.  While a bronc can be offered would certainly be a high risk for the patient given her current presentation. Initial recommendation was to continue IV antibiotics. Based on the conversation with the family I would like to focus on comfort and would like to avoid anything that can cause pain and suffering. Based on this condition currently comfort care. Family was informed that we will be stopping antibiotics and fluids.  Acute kidney injury. Hypernatremia. Hypokalemia. And admission serum sodium 157.  Serum creatinine 1.2.  Baseline 0.7. Treated with D5.  With improvement in serum sodium of 149. Mentation not baseline with progressively worsening in last 1 week per family. Suspect this is in the setting of poor p.o. intake likely associated with advancement of dementia. Currently  transition to comfort care. Stopping IV fluid.  Goals of care conversation. Extensive discussion with the family on the phone regarding goals of care conversation. Patient is minimally responsive, not following commands for me. Presents with aspiration event. Appears to have large lung abscess/possible malignancy. Also severely dehydrated with hypernatremia, hypokalemia, hyperchloremia.  And evidence of AKI. Appears to be underweight and malnourished. Severe dementia which appears to be progressing even back in notes in April 2024. Family's goal is to focus on comfort and avoiding any suffering or pain. Based on this conversation the family decided to transition the patient to complete comfort with goal of care transition to DNR. Eventual goal is transition to residential hospice based on patient's progression. Discussed with palliative care.  Elevated LFT. Likely in the setting of severe sepsis. Now comfort care.  Elevated troponin. Likely demand ischemia in the setting of dehydration, AKI as well as severe sepsis associated. Now comfort care.  Fecal impaction. Seen on the CT scan. No bowel obstruction. Dulcolax suppository and bowel regimen.  Aspiration/dysphagia. In the setting of dementia. Will allow oral diet with assistance although concern is that the patient will continue to aspirate. Currently comfort care.   Subjective: Minimally responsive.  Unable to follow any commands.  No other events overnight.  Physical Exam: General: in moderate distress, No Rash Cardiovascular: S1 and S2 Present, No Murmur Respiratory: Increased respiratory effort, Bilateral Air entry present.  Bilateral crackles, No wheezes Abdomen: Bowel Sound present, difficult to assess  tenderness Extremities: Trace edema Neuro: Drowsy, nonverbal withdraws to painful stimuli.  Data Reviewed: I have Reviewed nursing notes, Vitals, and Lab results. Since last encounter, pertinent lab results CBC and  BMP   . I have ordered test including    .  I have discussed pt's care plan and test results with palliative care  .   Disposition: Status is: Inpatient Remains inpatient appropriate because: On comfort measures.   Family Communication: Discussed with family on the phone. Level of care: Med-Surg switching from progressive to MedSurg Now she is comfort care. Vitals:   06/27/23 0528 06/27/23 0800 06/27/23 0830 06/27/23 0956  BP:  127/71 129/80   Pulse:  73 75   Resp:  16 15   Temp: 97.8 F (36.6 C)   98.1 F (36.7 C)  TempSrc: Axillary   Axillary  SpO2:  97% 97%   Weight:      Height:         Author: Lynden Oxford, MD 06/27/2023 12:04 PM  Please look on [email or web address] to find out who is on call.

## 2023-06-28 DIAGNOSIS — J690 Pneumonitis due to inhalation of food and vomit: Secondary | ICD-10-CM | POA: Diagnosis not present

## 2023-06-28 DIAGNOSIS — L8990 Pressure ulcer of unspecified site, unspecified stage: Secondary | ICD-10-CM | POA: Insufficient documentation

## 2023-06-28 NOTE — Plan of Care (Signed)

## 2023-06-28 NOTE — TOC Transition Note (Signed)
Transition of Care [facility]) - CM/SW Discharge Note   Patient Details  Name: Mikayla Ross MRN: [id number] Date of Birth: 1951-02-13  Transition of Care [facility]) CM/SW Contact:  Lorri Frederick, LCSW Phone Number: 06/28/2023, 1:35 PM   Clinical Narrative:   Pt discharging to [facility].  RN call report to [phone].      Final next level of care: Hospice Medical Facility Barriers to Discharge: Barriers Resolved   Patient Goals and CMS Choice   Choice offered to / list presented to : Adult Children (daughter [name])  Discharge Placement                Patient chooses bed at:  ([facility]) Patient to be transferred to facility by: [facility] Name of family member notified: daughter [name] Patient and family notified of of transfer: 06/28/23  Discharge Plan and Services Additional resources added to the After Visit Summary for   In-house Referral: Clinical Social Work   Post Acute Care Choice: Hospice                               Social Determinants of Health (SDOH) Interventions SDOH Screenings   Food Insecurity: No Food Insecurity (06/28/2023)  Housing: Patient Declined (06/28/2023)  Transportation Needs: No Transportation Needs (03/22/2023)  Depression (PHQ2-9): Medium Risk (01/27/2021)  Tobacco Use: Medium Risk (06/27/2023)     Readmission Risk Interventions     No data to display

## 2023-06-28 NOTE — TOC Initial Note (Addendum)
Transition of Care [facility]) - Initial/Assessment Note    Patient Details  Name: Mikayla Ross MRN: [id number] Date of Birth: 1951-08-23  [facility]) CM/SW Contact:    Lorri Frederick, LCSW Phone Number: 06/28/2023, 9:00 AM  Clinical Narrative:    CSW spoke with pt daughter [name] regarding plan.  She confirmed desire for residential hospice placement.  Choice discussed and she would like [facility] facility if possible.  Pt is from home with husband and daughter [name].  No current services.    CSW spoke with Cherie/[facility] [place] and she will review the pt for residential eligibility.    1220: Message from Cherie/Hospice.  Pt has been accepted for residential hospice and can admit today.  MD informed.       1325:  TC [place].  CSW confirmed plan for pt to DC to [facility] [place] this afternoon.             Expected Discharge Plan: Hospice Medical Facility Barriers to Discharge: Other (must enter comment) (pending residential hospice eligibility and available bed)   Patient Goals and CMS Choice     Choice offered to / list presented to : Adult Children (daughter [name])      Expected Discharge Plan and Services In-house Referral: Clinical Social Work   Post Acute Care Choice: Hospice Living arrangements for the past 2 months: Single Family Home                                      Prior Living Arrangements/Services Living arrangements for the past 2 months: Single Family Home Lives with:: Spouse, Adult Children (lives with husband and daughter [name]) Patient language and need for interpreter reviewed:: No        Need for Family Participation in Patient Care: Yes (Comment) Care giver support system in place?: Yes (comment) Current home services: Other (comment) (none) Criminal Activity/Legal Involvement Pertinent to Current Situation/Hospitalization: No - Comment as needed  Activities of Daily Living Home Assistive Devices/Equipment:  Bedside commode/3-in-1, Wheelchair ADL Screening (condition at time of admission) Patient's cognitive ability adequate to safely complete daily activities?: No Is the patient deaf or have difficulty hearing?: No Does the patient have difficulty seeing, even when wearing glasses/contacts?: No Does the patient have difficulty concentrating, remembering, or making decisions?: Yes Patient able to express need for assistance with ADLs?: No Does the patient have difficulty dressing or bathing?: Yes Independently performs ADLs?: No Communication: Dependent Dressing (OT): Dependent Grooming: Dependent Is this a change from baseline?: Pre-admission baseline Feeding: Dependent Is this a change from baseline?: Pre-admission baseline Bathing: Dependent Is this a change from baseline?: Pre-admission baseline Toileting: Dependent Is this a change from baseline?: Pre-admission baseline In/Out Bed: Dependent Is this a change from baseline?: Pre-admission baseline Walks in Home: Dependent Is this a change from baseline?: Pre-admission baseline Does the patient have difficulty walking or climbing stairs?: Yes Weakness of Legs: Both Weakness of Arms/Hands: Both  Permission Sought/Granted                  Emotional Assessment Appearance:: Appears stated age Attitude/Demeanor/Rapport: Unable to Assess Affect (typically observed): Unable to Assess Orientation: : Oriented to Self      Admission diagnosis:  Hypokalemia [E87.6] Hypernatremia [E87.0] Aspiration pneumonia (HCC) [J69.0] Pneumonia with lung abscess (HCC) [J85.1] Acute respiratory failure with hypoxia (HCC) [J96.01] Community acquired pneumonia, unspecified  laterality [J18.9] Sepsis, due to unspecified organism, unspecified whether acute organ dysfunction present [facility]) [A41.9] Patient Active Problem List   Diagnosis Date Noted   Severe sepsis (HCC) 06/27/2023   Acute respiratory failure with hypoxia (HCC) 06/27/2023    Transaminitis 06/27/2023   Acute hypernatremia 06/27/2023   Dehydration 06/27/2023   Cavitary lesion of lung 06/27/2023   Hypercalcemia 06/27/2023   Hypokalemia 06/27/2023   AKI (acute kidney injury) (HCC) 06/27/2023   Pneumonia with lung abscess (HCC) 06/27/2023   Aspiration pneumonia (HCC) 06/26/2023   Alzheimer's dementia with behavioral disturbance (HCC) 12/27/2020   Major neurocognitive disorder, unclear etiology 10/06/2019   Stroke    Hyperlipidemia    Obstructive sleep apnea    High risk for hip fracture 02/21/2018   Tobacco abuse 02/14/2018   Estrogen deficiency 02/14/2018   Bilateral carotid artery stenosis 01/05/2018   Right-sided Bell's palsy 12/27/2017   PCP:  Sharlene Dory, DO Pharmacy:   [facility] [address], [place] - [address] [place] [address] Phone: [phone] Fax: [phone]     Social Determinants of Health (SDOH) Social History: SDOH Screenings   Food Insecurity: No Food Insecurity (06/28/2023)  Housing: Patient Declined (06/28/2023)  Transportation Needs: No Transportation Needs (03/22/2023)  Depression (PHQ2-9): Medium Risk (01/27/2021)  Tobacco Use: Medium Risk (06/27/2023)   SDOH Interventions:     Readmission Risk Interventions     No data to display

## 2023-06-28 NOTE — Discharge Summary (Signed)
Physician Discharge Summary   Patient: Mikayla Ross MRN: [id number] DOB: 1950/12/16  Admit date:     06/26/2023  Discharge date: 06/28/23  Discharge Physician: Lynden Oxford  PCP: Sharlene Dory, DO  Recommendations at discharge:  Establish care with hospice.   Discharge Diagnoses: Principal Problem:   Aspiration pneumonia (HCC) Active Problems:   Hyperlipidemia   Severe sepsis (HCC)   Acute respiratory failure with hypoxia (HCC)   Transaminitis   Acute hypernatremia   Dehydration   Cavitary lesion of lung   Hypercalcemia   Hypokalemia   AKI (acute kidney injury) (HCC)   Pneumonia with lung abscess (HCC)   Pressure injury of skin  Brief hospital course: PMH of advanced Alzheimer's dementia, carotid stenosis, HLD, OSA, presented to hospital with complaints of choking episode with change in appetite. Found to have aspiration pneumonia with lung abscess and evidence of dehydration with hypernatremia and AKI. Discussion with the family was done on 7/28 and family decided transition to complete comfort with eventual plan for residential hospice.  Assessment and Plan: Severe sepsis with aspiration pneumonia and lung abscess. Acute hypoxic respiratory failure. Met SIRS criteria with leukocytosis and tachypnea with evidence of infection. Presents with aspiration.  Also had some shortness of breath. Saturation dropped to 84% on room air.  Does not use oxygen at her baseline. Currently requiring 6 L of oxygen. Prior history of aspiration secondary to progressive dementia. CT chest shows evidence of pneumonia as well as lung abscess.  Malignancy cannot be ruled out. Pulmonary was consulted.  While a bronc can be offered would certainly be a high risk for the patient given her current presentation. Initial recommendation was to continue IV antibiotics. Based on the conversation with the family I would like to focus on comfort and would like to avoid anything that can cause pain  and suffering. Based on this condition currently comfort care. Family was informed that we will be stopping antibiotics and fluids.  Acute kidney injury. Hypernatremia. Hypokalemia. And admission serum sodium 157.  Serum creatinine 1.2.  Baseline 0.7. Treated with D5.  With improvement in serum sodium of 149. Mentation not baseline with progressively worsening in last 1 week per family. Suspect this is in the setting of poor p.o. intake likely associated with advancement of dementia. Currently transition to comfort care. Stopping IV fluid.  Goals of care conversation. Extensive discussion with the family on the phone regarding goals of care conversation. Patient is minimally responsive, not following commands for me. Presents with aspiration event. Appears to have large lung abscess/possible malignancy. Also severely dehydrated with hypernatremia, hypokalemia, hyperchloremia.  And evidence of AKI. Appears to be underweight and malnourished. Severe dementia which appears to be progressing even back in notes in April 2024. Family's goal is to focus on comfort and avoiding any suffering or pain. Based on this conversation the family decided to transition the patient to complete comfort with goal of care transition to DNR. Eventual goal is transition to residential hospice based on patient's progression. Discussed with palliative care.  Elevated LFT. Likely in the setting of severe sepsis. Now comfort care.  Elevated troponin. Likely demand ischemia in the setting of dehydration, AKI as well as severe sepsis associated. Now comfort care.  Fecal impaction. Seen on the CT scan. No bowel obstruction. Dulcolax suppository and bowel regimen.  Aspiration/dysphagia. In the setting of dementia. Will allow oral diet with assistance although concern is that the patient will continue to aspirate. Currently comfort care.  Stage 1 mid  sacrum pressure ulcer, POA Foam dressing.  Pressure  Injury 06/27/23 Sacrum Mid Stage 1 -  Intact skin with non-blanchable redness of a localized area usually over a bony prominence. in fold of buttocks, upper area, foam dressing applied for protection (Active)  06/27/23 1310  Location: Sacrum  Location Orientation: Mid  Staging: Stage 1 -  Intact skin with non-blanchable redness of a localized area usually over a bony prominence.  Wound Description (Comments): in fold of buttocks, upper area, foam dressing applied for protection  Present on Admission: Yes     Consultants:  none  Procedures performed:  none  DISCHARGE MEDICATION: Allergies as of 06/28/2023   No Known Allergies      Medication List     STOP taking these medications    atorvastatin 40 MG tablet Commonly known as: LIPITOR   buPROPion 150 MG 24 hr tablet Commonly known as: WELLBUTRIN XL   memantine 10 MG tablet Commonly known as: NAMENDA   QUEtiapine 25 MG tablet Commonly known as: SEROQUEL   sertraline 50 MG tablet Commonly known as: ZOLOFT       Disposition: Residential hospice Diet recommendation: comfort feeds  Discharge Exam: Vitals:   06/27/23 1455 06/27/23 2136 06/28/23 0525 06/28/23 0854  BP: 133/76 111/64 128/86 131/66  Pulse:  88 87 92  Resp: 16 16 16 15   Temp:  97.8 F (36.6 C) 98.3 F (36.8 C) 98.7 F (37.1 C)  TempSrc:  Oral Oral   SpO2: 98% 100% 90% 98%  Weight:      Height:       General: Appear in mild distress; no visible Abnormal Neck Mass Or lumps, Conjunctiva normal Cardiovascular: S1 and S2 Present, no Murmur, Respiratory: good respiratory effort, Bilateral Air entry present and bilateral Crackles, no wheezes Abdomen: Bowel Sound present,  Extremities: no Pedal edema Neurology: lethargic and non verbal  Filed Weights   06/26/23 2010  Weight: 56.7 kg   Condition at discharge: stable  The results of significant diagnostics from this hospitalization (including imaging, microbiology, ancillary and laboratory) are  listed below for reference.   Imaging Studies: CT Angio Chest PE W and/or Wo Contrast  Result Date: 06/26/2023 CLINICAL DATA:  Abdominal pain, difficulty breathing EXAM: CT ANGIOGRAPHY CHEST CT ABDOMEN AND PELVIS WITH CONTRAST TECHNIQUE: Multidetector CT imaging of the chest was performed using the standard protocol during bolus administration of intravenous contrast. Multiplanar CT image reconstructions and MIPs were obtained to evaluate the vascular anatomy. Multidetector CT imaging of the abdomen and pelvis was performed using the standard protocol during bolus administration of intravenous contrast. RADIATION DOSE REDUCTION: This exam was performed according to the departmental dose-optimization program which includes automated exposure control, adjustment of the mA and/or kV according to patient size and/or use of iterative reconstruction technique. CONTRAST:  75mL OMNIPAQUE IOHEXOL 350 MG/ML SOLN COMPARISON:  06/26/2023 FINDINGS: CTA CHEST FINDINGS Cardiovascular: This is a technically adequate evaluation of the pulmonary vasculature. No filling defects or pulmonary emboli. The heart is unremarkable without pericardial effusion. No evidence of thoracic aortic aneurysm or dissection. Atherosclerosis of the aorta and coronary vasculature. Mild stenosis at the origin of the left subclavian artery approaches 50%. Mediastinum/Nodes: Subcarinal adenopathy measuring up to 12 mm in short axis. Thyroid, trachea, and esophagus are unremarkable. Lungs/Pleura: There is a thick-walled cavitary mass within the right lower lobe measuring 5.5 x 4.1 by 4.7 cm. It is unclear whether this reflects a pulmonary abscess or cavitary neoplasm. There is complete occlusion of the right mainstem bronchus,  proximal right upper lobe bronchus, bronchus intermedius, and right lower lobe bronchus. There is dense consolidation throughout the right lower lobe, greatest posteriorly. Central obstructing neoplasm cannot be excluded. No  effusion or pneumothorax. Musculoskeletal: There are no acute displaced fractures. Nonspecific sclerotic focus within the right anterior fifth rib. No destructive bony lesions. Reconstructed images demonstrate no additional findings. Review of the MIP images confirms the above findings. CT ABDOMEN and PELVIS FINDINGS Hepatobiliary: No focal liver abnormality is seen. No gallstones, gallbladder wall thickening, or biliary dilatation. Pancreas: Unremarkable. No pancreatic ductal dilatation or surrounding inflammatory changes. Spleen: Normal in size without focal abnormality. Adrenals/Urinary Tract: Adrenal glands are unremarkable. Kidneys are normal, without renal calculi, focal lesion, or hydronephrosis. Bladder is unremarkable. Stomach/Bowel: No bowel obstruction or ileus. Large amount of stool within the rectal vault consistent with fecal impaction. No bowel wall thickening or inflammatory change. Vascular/Lymphatic: Aortic atherosclerosis. No enlarged abdominal or pelvic lymph nodes. Reproductive: Status post hysterectomy. No adnexal masses. Other: No free fluid or free intraperitoneal gas. No abdominal wall hernia. Musculoskeletal: No acute or destructive bony abnormalities. Reconstructed images demonstrate no additional findings. Review of the MIP images confirms the above findings. IMPRESSION: Chest: 1. No evidence of pulmonary embolus. 2. Thick-walled cavitary masslike lesion within the right lower lobe, with associated dense right lower lobe consolidation. This could reflect neoplasm or cavitating pneumonia. Pulmonology consultation and bronchoscopy may be useful. 3. Opacification of the right mainstem bronchus, proximal right upper lobe bronchus, and bronchus intermedius and right lower lobe bronchial tree. Differential includes infection or obstructing neoplasm. Again, bronchoscopy may be useful. 4. Subcarinal adenopathy, which could be reactive or metastatic. 5.  Aortic Atherosclerosis (ICD10-I70.0).  Abdomen/pelvis: 1. Fecal impaction.  No bowel obstruction or ileus. 2.  Aortic Atherosclerosis (ICD10-I70.0). Electronically Signed   By: Sharlet Salina M.D.   On: 06/26/2023 22:03   CT ABDOMEN PELVIS WO CONTRAST  Result Date: 06/26/2023 CLINICAL DATA:  Abdominal pain, difficulty breathing EXAM: CT ANGIOGRAPHY CHEST CT ABDOMEN AND PELVIS WITH CONTRAST TECHNIQUE: Multidetector CT imaging of the chest was performed using the standard protocol during bolus administration of intravenous contrast. Multiplanar CT image reconstructions and MIPs were obtained to evaluate the vascular anatomy. Multidetector CT imaging of the abdomen and pelvis was performed using the standard protocol during bolus administration of intravenous contrast. RADIATION DOSE REDUCTION: This exam was performed according to the departmental dose-optimization program which includes automated exposure control, adjustment of the mA and/or kV according to patient size and/or use of iterative reconstruction technique. CONTRAST:  75mL OMNIPAQUE IOHEXOL 350 MG/ML SOLN COMPARISON:  06/26/2023 FINDINGS: CTA CHEST FINDINGS Cardiovascular: This is a technically adequate evaluation of the pulmonary vasculature. No filling defects or pulmonary emboli. The heart is unremarkable without pericardial effusion. No evidence of thoracic aortic aneurysm or dissection. Atherosclerosis of the aorta and coronary vasculature. Mild stenosis at the origin of the left subclavian artery approaches 50%. Mediastinum/Nodes: Subcarinal adenopathy measuring up to 12 mm in short axis. Thyroid, trachea, and esophagus are unremarkable. Lungs/Pleura: There is a thick-walled cavitary mass within the right lower lobe measuring 5.5 x 4.1 by 4.7 cm. It is unclear whether this reflects a pulmonary abscess or cavitary neoplasm. There is complete occlusion of the right mainstem bronchus, proximal right upper lobe bronchus, bronchus intermedius, and right lower lobe bronchus. There is dense  consolidation throughout the right lower lobe, greatest posteriorly. Central obstructing neoplasm cannot be excluded. No effusion or pneumothorax. Musculoskeletal: There are no acute displaced fractures. Nonspecific sclerotic focus within the right  anterior fifth rib. No destructive bony lesions. Reconstructed images demonstrate no additional findings. Review of the MIP images confirms the above findings. CT ABDOMEN and PELVIS FINDINGS Hepatobiliary: No focal liver abnormality is seen. No gallstones, gallbladder wall thickening, or biliary dilatation. Pancreas: Unremarkable. No pancreatic ductal dilatation or surrounding inflammatory changes. Spleen: Normal in size without focal abnormality. Adrenals/Urinary Tract: Adrenal glands are unremarkable. Kidneys are normal, without renal calculi, focal lesion, or hydronephrosis. Bladder is unremarkable. Stomach/Bowel: No bowel obstruction or ileus. Large amount of stool within the rectal vault consistent with fecal impaction. No bowel wall thickening or inflammatory change. Vascular/Lymphatic: Aortic atherosclerosis. No enlarged abdominal or pelvic lymph nodes. Reproductive: Status post hysterectomy. No adnexal masses. Other: No free fluid or free intraperitoneal gas. No abdominal wall hernia. Musculoskeletal: No acute or destructive bony abnormalities. Reconstructed images demonstrate no additional findings. Review of the MIP images confirms the above findings. IMPRESSION: Chest: 1. No evidence of pulmonary embolus. 2. Thick-walled cavitary masslike lesion within the right lower lobe, with associated dense right lower lobe consolidation. This could reflect neoplasm or cavitating pneumonia. Pulmonology consultation and bronchoscopy may be useful. 3. Opacification of the right mainstem bronchus, proximal right upper lobe bronchus, and bronchus intermedius and right lower lobe bronchial tree. Differential includes infection or obstructing neoplasm. Again, bronchoscopy may be  useful. 4. Subcarinal adenopathy, which could be reactive or metastatic. 5.  Aortic Atherosclerosis (ICD10-I70.0). Abdomen/pelvis: 1. Fecal impaction.  No bowel obstruction or ileus. 2.  Aortic Atherosclerosis (ICD10-I70.0). Electronically Signed   By: Sharlet Salina M.D.   On: 06/26/2023 22:03   DG Chest Port 1 View  Result Date: 06/26/2023 CLINICAL DATA:  Difficulty breathing EXAM: PORTABLE CHEST 1 VIEW COMPARISON:  None Available. FINDINGS: Left lung is clear. Asymmetric right infrahilar opacity. Normal cardiac size with aortic atherosclerosis. No pneumothorax IMPRESSION: Asymmetric vague right infrahilar opacity, indeterminate for pneumonia, aspiration or mass. Suggest further assessment with chest CT. Electronically Signed   By: Jasmine Pang M.D.   On: 06/26/2023 20:16    Microbiology: Results for orders placed or performed during the hospital encounter of 06/26/23  Resp panel by RT-PCR (RSV, Flu A&B, Covid) Anterior Nasal Swab     Status: None   Collection Time: 06/26/23  8:10 PM   Specimen: Anterior Nasal Swab  Result Value Ref Range Status   SARS Coronavirus 2 by RT PCR NEGATIVE NEGATIVE Final   Influenza A by PCR NEGATIVE NEGATIVE Final   Influenza B by PCR NEGATIVE NEGATIVE Final    Comment: (NOTE) The Xpert Xpress SARS-CoV-2/FLU/RSV plus assay is intended as an aid in the diagnosis of influenza from Nasopharyngeal swab specimens and should not be used as a sole basis for treatment. Nasal washings and aspirates are unacceptable for Xpert Xpress SARS-CoV-2/FLU/RSV testing.  Fact Sheet for Patients: BloggerCourse.com  Fact Sheet for Healthcare Providers: SeriousBroker.it  This test is not yet approved or cleared by the [place] FDA and has been authorized for detection and/or diagnosis of SARS-CoV-2 by FDA under an Emergency Use Authorization (EUA). This EUA will remain in effect (meaning this test can be used) for the  duration of the COVID-19 declaration under Section 564(b)(1) of the Act, 21 U.S.C. section 360bbb-3(b)(1), unless the authorization is terminated or revoked.     Resp Syncytial Virus by PCR NEGATIVE NEGATIVE Final    Comment: (NOTE) Fact Sheet for Patients: BloggerCourse.com  Fact Sheet for Healthcare Providers: SeriousBroker.it  This test is not yet approved or cleared by the [place] FDA and has  been authorized for detection and/or diagnosis of SARS-CoV-2 by FDA under an Emergency Use Authorization (EUA). This EUA will remain in effect (meaning this test can be used) for the duration of the COVID-19 declaration under Section 564(b)(1) of the Act, 21 U.S.C. section 360bbb-3(b)(1), unless the authorization is terminated or revoked.  Performed at [facility], [address] [place], [place] [address]   Blood culture (routine x 2)     Status: None (Preliminary result)   Collection Time: 06/26/23  8:45 PM   Specimen: BLOOD  Result Value Ref Range Status   Specimen Description BLOOD BLOOD RIGHT ARM  Final   Special Requests   Final    BOTTLES DRAWN AEROBIC AND ANAEROBIC Blood Culture results may not be optimal due to an excessive volume of blood received in culture bottles   Culture   Final    NO GROWTH < 12 HOURS Performed at [facility], [address], [place], [place] [address]    Report Status PENDING  Incomplete  Blood culture (routine x 2)     Status: None (Preliminary result)   Collection Time: 06/26/23  8:47 PM   Specimen: BLOOD  Result Value Ref Range Status   Specimen Description BLOOD BLOOD LEFT ARM  Final   Special Requests   Final    BOTTLES DRAWN AEROBIC AND ANAEROBIC Blood Culture adequate volume   Culture   Final    NO GROWTH < 12 HOURS Performed at [facility], [address] [place], [place] [address]    Report Status PENDING  Incomplete   Labs: CBC: Recent Labs  Lab  06/26/23 2010 06/26/23 2017 06/27/23 0245  WBC 20.9*  --  29.0*  NEUTROABS 19.2*  --  28.4*  HGB 11.7* 11.9* 10.4*  HCT 39.2 35.0* 34.4*  MCV 99.7  --  99.4  PLT 380  --  336   Basic Metabolic Panel: Recent Labs  Lab 06/26/23 2010 06/26/23 2017 06/26/23 2208 06/27/23 0245 06/27/23 0847  NA 156* 157*  --  151* 149*  K 2.7* 2.7*  --  3.3* 3.0*  CL 112*  --   --  114* 113*  CO2 25  --   --  26 26  GLUCOSE 151*  --   --  138* 156*  BUN 40*  --   --  38* 37*  CREATININE 1.25*  --   --  1.17* 0.94  CALCIUM 9.5  --   --  9.0 8.7*  MG  --   --  2.4 3.1*  --    Liver Function Tests: Recent Labs  Lab 06/26/23 2010 06/27/23 0245  AST 67* 107*  ALT 106* 109*  ALKPHOS 232* 199*  BILITOT 0.9 0.7  PROT 6.5 5.9*  ALBUMIN 2.3* 2.1*   CBG: No results for input(s): "GLUCAP" in the last 168 hours.  Discharge time spent: greater than 30 minutes.  Author: Lynden Oxford, MD  [facility]

## 2023-06-28 NOTE — Progress Notes (Signed)
   Pt referral for Hospice Home in [facility]. Spoke to the daughter [name] who was at work and unable to meet at this time. She was introduced to hospice services and transition from aggressive curative treatment to a comfort care treatment for the pt. She agrees this is what is best for her mother. She was able to complete paperwork by docusign agreement. This has been returned and our MD has reviewed and approved pt for hospice services at our facility. We offered the bed to pt family and they accepted we can accommodate her today. Request pt be picked dup at 330pm -Norm Parcel RN [phone]

## 2023-06-28 NOTE — Progress Notes (Signed)
Pt d/c to inpatient hospice.  Printed out AVS for [facility] and outside facility.  Placed all documentation in folder for outside facility.  Informed pt that she was bring d/c to inpatient hospice in [facility], [place] and that [facility] will be here to to transport her to the facility.  Answered any pending questions. Pt had no further questions. Removed PIV which was CDI and free from s/sx of infection upon removal. Assisted pt in getting dressed and gathering her belongings. Currently awaiting [facility] for discharge to outside facility.

## 2023-06-28 NOTE — Progress Notes (Signed)
NEW ADMISSION NOTE New Admission Note: Palliative Care  Arrival Method: Stretcher from ER Mental Orientation: can state name only Telemetry: no Assessment: Completed Skin: pressure injury in sacrum IV: NS locks in BUE Pain: denies Tubes: none Safety Measures: Safety Fall Prevention Plan has been given, discussed and signed Admission: Completed [facility] Orientation: Patient has been orientated to the room, unit and staff.  Family: present  Orders have been reviewed and implemented. Will continue to monitor the patient. Call light has been placed within reach and bed alarm has been activated.   Irwin Brakeman, RN

## 2023-07-01 LAB — CULTURE, BLOOD (ROUTINE X 2)
Culture: NO GROWTH
Culture: NO GROWTH
Special Requests: ADEQUATE

## 2023-08-01 DEATH — deceased

## 2023-09-03 ENCOUNTER — Other Ambulatory Visit: Payer: Self-pay | Admitting: Family Medicine

## 2023-09-03 DIAGNOSIS — Z1211 Encounter for screening for malignant neoplasm of colon: Secondary | ICD-10-CM

## 2023-09-03 DIAGNOSIS — Z1212 Encounter for screening for malignant neoplasm of rectum: Secondary | ICD-10-CM
